# Patient Record
Sex: Female | Born: 1980 | Race: Asian | Hispanic: No | Marital: Married | Smoking: Former smoker
Health system: Southern US, Community
[De-identification: ages and names within clinical notes are randomized; demographics above are authoritative.]

## PROBLEM LIST (undated history)

## (undated) DIAGNOSIS — K219 Gastro-esophageal reflux disease without esophagitis: Secondary | ICD-10-CM

## (undated) DIAGNOSIS — R000 Tachycardia, unspecified: Secondary | ICD-10-CM

---

## 2012-02-05 ENCOUNTER — Ambulatory Visit: Payer: Self-pay | Admitting: Family Medicine

## 2015-10-19 ENCOUNTER — Encounter: Payer: Self-pay | Admitting: Emergency Medicine

## 2015-10-19 DIAGNOSIS — Z87891 Personal history of nicotine dependence: Secondary | ICD-10-CM | POA: Insufficient documentation

## 2015-10-19 DIAGNOSIS — R509 Fever, unspecified: Secondary | ICD-10-CM | POA: Diagnosis present

## 2015-10-19 DIAGNOSIS — J069 Acute upper respiratory infection, unspecified: Secondary | ICD-10-CM | POA: Insufficient documentation

## 2015-10-19 LAB — RAPID INFLUENZA A&B ANTIGENS (ARMC ONLY)
INFLUENZA A (ARMC): NEGATIVE
INFLUENZA B (ARMC): NEGATIVE

## 2015-10-19 LAB — POCT RAPID STREP A: STREPTOCOCCUS, GROUP A SCREEN (DIRECT): NEGATIVE

## 2015-10-19 NOTE — ED Notes (Signed)
Pt presents to ED with fever sore throat congestion and headache since Saturday. Pt states she has been taking tylenol at home with very little relief. Pt currently has no increased work of breathing or acute distress noted at this time. Wants to know if she has strep throat. Last time medicated for fever was "just before coming" to ED.

## 2015-10-20 ENCOUNTER — Emergency Department
Admission: EM | Admit: 2015-10-20 | Discharge: 2015-10-20 | Disposition: A | Discharge status: Home / Self Care | Attending: Emergency Medicine | Admitting: Emergency Medicine

## 2015-10-20 DIAGNOSIS — J069 Acute upper respiratory infection, unspecified: Secondary | ICD-10-CM

## 2015-10-20 HISTORY — DX: Gastro-esophageal reflux disease without esophagitis: K21.9

## 2015-10-20 MED ORDER — AZITHROMYCIN 500 MG PO TABS
500.0000 mg | ORAL_TABLET | Freq: Once | ORAL | Status: AC
  Administered 2015-10-20: 500 mg via ORAL
  Filled 2015-10-20: qty 1

## 2015-10-20 MED ORDER — AZITHROMYCIN 500 MG PO TABS: 500.0000 mg | ORAL_TABLET | Freq: Every day | ORAL | Status: AC

## 2015-10-20 NOTE — ED Notes (Signed)
Pt discharged to home.  Family member driving.  Discharge instructions reviewed.  Verbalized understanding.  No questions or concerns at this time.  Teach back verified.  Pt in NAD.  No items left in ED.   

## 2015-10-20 NOTE — Discharge Instructions (Signed)
Upper Respiratory Infection, Adult Most upper respiratory infections (URIs) are a viral infection of the air passages leading to the lungs. A URI affects the nose, throat, and upper air passages. The most common type of URI is nasopharyngitis and is typically referred to as "the common cold." URIs run their course and usually go away on their own. Most of the time, a URI does not require medical attention, but sometimes a bacterial infection in the upper airways can follow a viral infection. This is called a secondary infection. Sinus and middle ear infections are common types of secondary upper respiratory infections. Bacterial pneumonia can also complicate a URI. A URI can worsen asthma and chronic obstructive pulmonary disease (COPD). Sometimes, these complications can require emergency medical care and may be life threatening.  CAUSES Almost all URIs are caused by viruses. A virus is a type of germ and can spread from one person to another.  RISKS FACTORS You may be at risk for a URI if:   You smoke.   You have chronic heart or lung disease.  You have a weakened defense (immune) system.   You are very young or very old.   You have nasal allergies or asthma.  You work in crowded or poorly ventilated areas.  You work in health care facilities or schools. SIGNS AND SYMPTOMS  Symptoms typically develop 2-3 days after you come in contact with a cold virus. Most viral URIs last 7-10 days. However, viral URIs from the influenza virus (flu virus) can last 14-18 days and are typically more severe. Symptoms may include:   Runny or stuffy (congested) nose.   Sneezing.   Cough.   Sore throat.   Headache.   Fatigue.   Fever.   Loss of appetite.   Pain in your forehead, behind your eyes, and over your cheekbones (sinus pain).  Muscle aches.  DIAGNOSIS  Your health care provider may diagnose a URI by:  Physical exam.  Tests to check that your symptoms are not due to  another condition such as:  Strep throat.  Sinusitis.  Pneumonia.  Asthma. TREATMENT  A URI goes away on its own with time. It cannot be cured with medicines, but medicines may be prescribed or recommended to relieve symptoms. Medicines may help:  Reduce your fever.  Reduce your cough.  Relieve nasal congestion. HOME CARE INSTRUCTIONS   Take medicines only as directed by your health care provider.   Gargle warm saltwater or take cough drops to comfort your throat as directed by your health care provider.  Use a warm mist humidifier or inhale steam from a shower to increase air moisture. This may make it easier to breathe.  Drink enough fluid to keep your urine clear or pale yellow.   Eat soups and other clear broths and maintain good nutrition.   Rest as needed.   Return to work when your temperature has returned to normal or as your health care provider advises. You may need to stay home longer to avoid infecting others. You can also use a face mask and careful hand washing to prevent spread of the virus.  Increase the usage of your inhaler if you have asthma.   Do not use any tobacco products, including cigarettes, chewing tobacco, or electronic cigarettes. If you need help quitting, ask your health care provider. PREVENTION  The best way to protect yourself from getting a cold is to practice good hygiene.   Avoid oral or hand contact with people with cold   symptoms.   Wash your hands often if contact occurs.  There is no clear evidence that vitamin C, vitamin E, echinacea, or exercise reduces the chance of developing a cold. However, it is always recommended to get plenty of rest, exercise, and practice good nutrition.  SEEK MEDICAL CARE IF:   You are getting worse rather than better.   Your symptoms are not controlled by medicine.   You have chills.  You have worsening shortness of breath.  You have Marshal Schrecengost or red mucus.  You have yellow or Seydou Hearns nasal  discharge.  You have pain in your face, especially when you bend forward.  You have a fever.  You have swollen neck glands.  You have pain while swallowing.  You have [place] areas in the back of your throat. SEEK IMMEDIATE MEDICAL CARE IF:   You have severe or persistent:  Headache.  Ear pain.  Sinus pain.  Chest pain.  You have chronic lung disease and any of the following:  Wheezing.  Prolonged cough.  Coughing up blood.  A change in your usual mucus.  You have a stiff neck.  You have changes in your:  Vision.  Hearing.  Thinking.  Mood. MAKE SURE YOU:   Understand these instructions.  Will watch your condition.  Will get help right away if you are not doing well or get worse.   This information is not intended to replace advice given to you by your health care provider. Make sure you discuss any questions you have with your health care provider.   Document Released: 01/03/2001 Document Revised: 11/24/2014 Document Reviewed: 10/15/2013 Elsevier Interactive Patient Education 2016 Elsevier Inc.  

## 2015-10-22 LAB — CULTURE, GROUP A STREP (THRC)

## 2015-10-22 NOTE — ED Provider Notes (Signed)
[facility] Emergency Department Provider Note  ____________________________________________  Time seen: 2:00 AM  I have reviewed the triage vital signs and the nursing notes.   HISTORY  Chief Complaint Fever; Sore Throat; Headache; and Nasal Congestion    HPI Paula Andrade is a 35 y.o. female presents with fever sore throat and congestion accompanied by generalized headache since Saturday. Patient states that she's been taking Tylenol at home with minimal relief. Patient denies any dyspnea no nausea vomiting or diarrhea patient febrile on presentation to the emergency department a temperature 101.5     Past Medical History  Diagnosis Date  . GERD (gastroesophageal reflux disease)     There are no active problems to display for this patient.   Past Surgical History  Procedure Laterality Date  . Cesarean section      Current Outpatient Rx  Name  Route  Sig  Dispense  Refill  . azithromycin (ZITHROMAX) 500 MG tablet   Oral   Take 1 tablet (500 mg total) by mouth daily.   3 tablet   0     Allergies Review of patient's allergies indicates no known allergies.  No family history on file.  Social History Social History  Substance Use Topics  . Smoking status: Former Games developer  . Smokeless tobacco: Never Used  . Alcohol Use: Yes    Review of Systems  Constitutional: Negative for fever. Eyes: Negative for visual changes. ENT: Positive for sore throat. Positive for congestion Cardiovascular: Negative for chest pain. Respiratory: Negative for shortness of breath. Gastrointestinal: Negative for abdominal pain, vomiting and diarrhea. Genitourinary: Negative for dysuria. Musculoskeletal: Negative for back pain. Skin: Negative for rash. Neurological: Negative for headaches, focal weakness or numbness.   10-point ROS otherwise negative.  ____________________________________________   PHYSICAL EXAM:  VITAL SIGNS: ED Triage Vitals   Enc Vitals Group     BP 10/19/15 2226 100/55 mmHg     Pulse Rate 10/19/15 2226 105     Resp 10/19/15 2226 18     Temp 10/19/15 2226 101.5 F (38.6 C)     Temp Source 10/19/15 2226 Oral     SpO2 10/19/15 2226 96 %     Weight 10/19/15 2226 100 lb (45.36 kg)     Height 10/19/15 2226 5' (1.524 m)     Head Cir --      Peak Flow --      Pain Score 10/19/15 2227 7     Pain Loc --      Pain Edu? --      Excl. in GC? --      Constitutional: Alert and oriented. Well appearing and in no distress. Eyes: Conjunctivae are normal. PERRL. Normal extraocular movements. ENT   Head: Normocephalic and atraumatic.   Nose: No congestion/rhinnorhea.   Mouth/Throat: Mucous membranes are moist.Positive for pharyngeal erythema   Neck: No stridor. Hematological/Lymphatic/Immunilogical: No cervical lymphadenopathy. Cardiovascular: Normal rate, regular rhythm. Normal and symmetric distal pulses are present in all extremities. No murmurs, rubs, or gallops. Respiratory: Normal respiratory effort without tachypnea nor retractions. Breath sounds are clear and equal bilaterally. No wheezes/rales/rhonchi. Gastrointestinal: Soft and nontender. No distention. There is no CVA tenderness. Genitourinary: deferred Musculoskeletal: Nontender with normal range of motion in all extremities. No joint effusions.  No lower extremity tenderness nor edema. Neurologic:  Normal speech and language. No gross focal neurologic deficits are appreciated. Speech is normal.  Skin:  Skin is warm, dry and intact. No rash noted. Psychiatric: Mood and affect are normal.  Speech and behavior are normal. Patient exhibits appropriate insight and judgment.  ____________________________________________    LABS (pertinent positives/negatives)  Labs Reviewed  RAPID INFLUENZA A&B ANTIGENS ([facility] ONLY)  CULTURE, GROUP A STREP [facility])  POCT RAPID STREP A      INITIAL IMPRESSION / ASSESSMENT AND PLAN / ED COURSE  Pertinent  labs & imaging results that were available during my care of the patient were reviewed by me and considered in my medical decision making (see chart for details).    ____________________________________________   FINAL CLINICAL IMPRESSION(S) / ED DIAGNOSES  Final diagnoses:  URI (upper respiratory infection)      Darci Currentandolph N Sharron Simpson, MD 10/22/15 [phone]

## 2016-10-18 ENCOUNTER — Emergency Department

## 2016-10-18 ENCOUNTER — Emergency Department
Admission: EM | Admit: 2016-10-18 | Discharge: 2016-10-18 | Disposition: A | Discharge status: Home / Self Care | Attending: Emergency Medicine | Admitting: Emergency Medicine

## 2016-10-18 ENCOUNTER — Encounter: Payer: Self-pay | Admitting: Emergency Medicine

## 2016-10-18 DIAGNOSIS — R000 Tachycardia, unspecified: Secondary | ICD-10-CM | POA: Diagnosis present

## 2016-10-18 DIAGNOSIS — R7989 Other specified abnormal findings of blood chemistry: Secondary | ICD-10-CM | POA: Diagnosis not present

## 2016-10-18 DIAGNOSIS — R778 Other specified abnormalities of plasma proteins: Secondary | ICD-10-CM

## 2016-10-18 DIAGNOSIS — I471 Supraventricular tachycardia: Secondary | ICD-10-CM

## 2016-10-18 DIAGNOSIS — Z87891 Personal history of nicotine dependence: Secondary | ICD-10-CM | POA: Insufficient documentation

## 2016-10-18 HISTORY — DX: Tachycardia, unspecified: R00.0

## 2016-10-18 LAB — BASIC METABOLIC PANEL
Anion gap: 8 (ref 5–15)
BUN: 12 mg/dL (ref 6–20)
CALCIUM: 8.9 mg/dL (ref 8.9–10.3)
CO2: 23 mmol/L (ref 22–32)
CREATININE: 0.68 mg/dL (ref 0.44–1.00)
Chloride: 107 mmol/L (ref 101–111)
GFR calc non Af Amer: >60 mL/min (ref >60)
Glucose, Bld: 116 mg/dL — ABNORMAL HIGH (ref 65–99)
Potassium: 3.5 mmol/L (ref 3.5–5.1)
SODIUM: 138 mmol/L (ref 135–145)

## 2016-10-18 LAB — CBC
HCT: 39.8 % (ref 35.0–47.0)
Hemoglobin: 13.6 g/dL (ref 12.0–16.0)
MCH: 31.6 pg (ref 26.0–34.0)
MCHC: 34.3 g/dL (ref 32.0–36.0)
MCV: 92.1 fL (ref 80.0–100.0)
PLATELETS: 212 10*3/uL (ref 150–440)
RBC: 4.32 MIL/uL (ref 3.80–5.20)
RDW: 13.1 % (ref 11.5–14.5)
WBC: 7.7 10*3/uL (ref 3.6–11.0)

## 2016-10-18 LAB — TROPONIN I: TROPONIN I: 0.63 ng/mL — AB (ref <0.03)

## 2016-10-18 LAB — MAGNESIUM: Magnesium: 1.9 mg/dL (ref 1.7–2.4)

## 2016-10-18 MED ORDER — ADENOSINE 6 MG/2ML IV SOLN
6.0000 mg | Freq: Once | INTRAVENOUS | Status: AC
  Administered 2016-10-18: 6 mg via INTRAVENOUS

## 2016-10-18 MED ORDER — METOPROLOL TARTRATE 25 MG PO TABS: ORAL_TABLET | ORAL | 1 refills | Status: AC

## 2016-10-18 MED ORDER — ADENOSINE 12 MG/4ML IV SOLN
INTRAVENOUS | Status: AC
  Filled 2016-10-18: qty 4

## 2016-10-18 MED ORDER — ADENOSINE 6 MG/2ML IV SOLN
INTRAVENOUS | Status: AC
  Administered 2016-10-18: 6 mg via INTRAVENOUS
  Filled 2016-10-18: qty 2

## 2016-10-18 NOTE — ED Notes (Signed)
Pt connected to monitor, zoll and EKG machine. Pt has NS bolus attached to IV. Pt has failed vagal maneuvers. MD at bedside. Ambu bag in place and suction ready if needed.

## 2016-10-18 NOTE — Discharge Instructions (Signed)
As we discussed, your signs and symptoms strongly suggest a condition called paroxysmal supraventricular tachycardia (PSVT).  This is a generally benign condition, but we recommend you follow up with the listed cardiologist for further evaluation.  Please read through the included information and try some of the techniques we discussed the next time you have the symptoms. ° °Return to the Emergency Department if you develop new or worsening symptoms that concern you. °

## 2016-10-18 NOTE — ED Triage Notes (Addendum)
Pt to ED via POV with c/o palpitations since this am. PT states this happened last year and she was not seen. Pt HR currently 160s. Pt c/o CP

## 2016-10-18 NOTE — ED Provider Notes (Signed)
[facility] Emergency Department Provider Note  ____________________________________________   First MD Initiated Contact with Patient 10/18/16 1637     (approximate)  I have reviewed the triage vital signs and the nursing notes.   HISTORY  Chief Complaint Tachycardia    HPI Paula Andrade is a 36 y.o. female who is generally healthy with no chronic medical issues and presents for evaluation of heart palpitations and chest tightness for about 8 hours.  She reports that this kind of sensation has happened to her in the past multiple times but it typically lasts for at most a few hours.  because it has been going on all day today she felt she should be evaluated.  Exertion makes her feel worse and rest does not help it feel better.she denies any recent illnesses, nausea/vomiting, abdominal pain.  She feels that it is a bit difficult to catch her breath.  The palpitations are moderate, the chest tightness and shortness of breath are mild.  The onset of the palpitations was acute while she was resting this morning.   Past Medical History:  Diagnosis Date  . GERD (gastroesophageal reflux disease)   . Tachycardia     There are no active problems to display for this patient.   Past Surgical History:  Procedure Laterality Date  . CESAREAN SECTION      Prior to Admission medications   Medication Sig Start Date End Date Taking? Authorizing Provider  TRINESSA LO 0.18/0.215/0.25 MG-25 MCG tab Take 1 tablet by mouth daily. 09/08/16  Yes Historical Provider, MD  metoprolol tartrate (LOPRESSOR) 25 MG tablet Take 1/2 tablet (12.5 mg) PO as needed every 4 hours if you develop palpitations/SVT 10/18/16 10/19/16  Loleta Rose, MD    Allergies Patient has no known allergies.  No family history on file.  Social History Social History  Substance Use Topics  . Smoking status: Former Games developer  . Smokeless tobacco: Never Used  . Alcohol use Yes    Review of  Systems Constitutional: No fever/chills Eyes: No visual changes. ENT: No sore throat. Cardiovascular: Palpitations and chest tightness. Respiratory: Mild SOB associated with the palpitations Gastrointestinal: No abdominal pain.  No nausea, no vomiting.  No diarrhea.  No constipation. Genitourinary: Negative for dysuria. Musculoskeletal: Negative for back pain. Skin: Negative for rash. Neurological: Negative for headaches, focal weakness or numbness.  10-point ROS otherwise negative.  ____________________________________________   PHYSICAL EXAM:  VITAL SIGNS: ED Triage Vitals [10/18/16 1625]  Enc Vitals Group     BP 102/76     Pulse Rate (!) 168     Resp 18     Temp 98.2 F (36.8 C)     Temp Source Oral     SpO2 98 %     Weight      Height      Head Circumference      Peak Flow      Pain Score      Pain Loc      Pain Edu?      Excl. in GC?     Constitutional: Alert and oriented. Well appearing and in no acute distress. Eyes: Conjunctivae are normal. PERRL. EOMI. Head: Atraumatic. Nose: No congestion/rhinnorhea. Mouth/Throat: Mucous membranes are moist. Neck: No stridor.  No meningeal signs.   Cardiovascular: rapid tachycardia, regular rhythm. Good peripheral circulation. Grossly normal heart sounds. Respiratory: Normal respiratory effort.  No retractions. Lungs CTAB. Gastrointestinal: Soft and nontender. No distention.  Musculoskeletal: No lower extremity tenderness nor edema. No gross  deformities of extremities. Neurologic:  Normal speech and language. No gross focal neurologic deficits are appreciated.  Skin:  Skin is warm, dry and intact. No rash noted. Psychiatric: Mood and affect are normal. Speech and behavior are normal.  ____________________________________________   LABS (all labs ordered are listed, but only abnormal results are displayed)  Labs Reviewed  BASIC METABOLIC PANEL - Abnormal; Notable for the following:       Result Value   Glucose,  Bld 116 (*)    All other components within normal limits  TROPONIN I - Abnormal; Notable for the following:    Troponin I 0.63 (*)    All other components within normal limits  CBC  MAGNESIUM   ____________________________________________  EKG  ED ECG REPORT #1 I, Wanona Stare, the attending physician, personally viewed and interpreted this ECG.  Date: 10/18/2016 EKG Time: 16:28 Rate: 162 Rhythm: SVT QRS Axis: normal Intervals: no P waves due to SVT ST/T Wave abnormalities: essentially globally inverted T waves. Conduction Disturbances: SVT Narrative Interpretation: Non-specific ST segment / T-wave changes, but no evidence of acute ischemia.   ED ECG REPORT #2 (post-adenosine) I, Dionicia Cerritos, the attending physician, personally viewed and interpreted this ECG.  Date: 10/18/2016 EKG Time: 16:57 Rate: 85 Rhythm: normal sinus rhythm QRS Axis: normal Intervals: normal ST/T Wave abnormalities: normal Conduction Disturbances: none Narrative Interpretation: unremarkable  ____________________________________________  RADIOLOGY   No results found.  ____________________________________________   PROCEDURES  Critical Care performed: Yes, see critical care procedure note(s)   Procedure(s) performed:   .Critical Care Performed by: Loleta Rose Authorized by: Loleta Rose   Critical care provider statement:    Critical care time (minutes):  30   Critical care time was exclusive of:  Separately billable procedures and treating other patients   Critical care was necessary to treat or prevent imminent or life-threatening deterioration of the following conditions:  Circulatory failure   Critical care was time spent personally by me on the following activities:  Development of treatment plan with patient or surrogate, discussions with consultants, evaluation of patient's response to treatment, examination of patient, obtaining history from patient or surrogate,  ordering and performing treatments and interventions, ordering and review of laboratory studies, ordering and review of radiographic studies, pulse oximetry, re-evaluation of patient's condition and review of old charts      ____________________________________________   INITIAL IMPRESSION / ASSESSMENT AND PLAN / ED COURSE  Pertinent labs & imaging results that were available during my care of the patient were reviewed by me and considered in my medical decision making (see chart for details).  the patient is in SVT with a normal blood pressure.  I tried multiple different vagal maneuvers and carotid massage and she did not have any improvement of her heart rate of about 160.  we are hooking her up to a Zoll and will proceed with chemical cardioversion after IV discussed the risks and benefits of adenosine and obtained verbal consent.   Clinical Course as of Oct 19 1931  Wed Oct 18, 2016  1700 The patient was cardioverted using adenosine from SVT to normal sinus rhythm with a heart rate of about 85.  The patient feels asymptomatic at this time.  I will check electrolytes and will not check a troponin given that it is likely to be unhelpful and misleading.  Allowing fluids to run.  [CF]  1703 Troponin was already ordered and is in process.  Will not cancel but will accept that there may be a  degree of demand ischemia.  [CF]  [id number] Spoke by phone with Dr[facility] Duke Salviaandolph [facility].  She agreed that repeat troponin is not necessary, suggested metoprolol tartrate 12.5 mg PO PRN for palpitations, and close outpatient follow up.  Will continue to monitor the patient, but anticipate discharge soon.  [CF]    Clinical Course User Index [CF] Loleta Roseory Orabelle Rylee, MD    ____________________________________________  FINAL CLINICAL IMPRESSION(S) / ED DIAGNOSES  Final diagnoses:  SVT (supraventricular tachycardia) (HCC)  Elevated troponin I level     MEDICATIONS GIVEN DURING THIS  VISIT:  Medications  adenosine (ADENOCARD) 12 MG/4ML injection (not administered)  adenosine (ADENOCARD) 6 MG/2ML injection 6 mg (6 mg Intravenous Given 10/18/16 1657)     NEW OUTPATIENT MEDICATIONS STARTED DURING THIS VISIT:  New Prescriptions   METOPROLOL TARTRATE (LOPRESSOR) 25 MG TABLET    Take 1/2 tablet (12.5 mg) PO as needed every 4 hours if you develop palpitations/SVT    Modified Medications   No medications on file    Discontinued Medications   No medications on file     Note:  This document was prepared using Dragon voice recognition software and may include unintentional dictation errors.    Loleta Roseory Kyrielle Urbanski, MD 10/18/16 [phone]

## 2016-10-18 NOTE — ED Notes (Signed)
Pt remained alert and oriented. Pt in NAD. BP stable and respiratory rate is WNL.

## 2016-10-19 ENCOUNTER — Telehealth: Payer: Self-pay

## 2016-10-19 NOTE — Telephone Encounter (Signed)
Lmov for patient to call back to make an follow up appointment  She was seen in ED on 10/18/16 for Tachycardia  Will try again later time.

## 2016-11-07 ENCOUNTER — Encounter: Payer: Self-pay | Admitting: Cardiology

## 2016-11-07 ENCOUNTER — Ambulatory Visit (INDEPENDENT_AMBULATORY_CARE_PROVIDER_SITE_OTHER): Admitting: Cardiology

## 2016-11-07 VITALS — BP 98/64 | HR 59 | Ht 60.0 in | Wt 118.0 lb

## 2016-11-07 DIAGNOSIS — I471 Supraventricular tachycardia: Secondary | ICD-10-CM | POA: Diagnosis not present

## 2016-11-07 NOTE — Patient Instructions (Addendum)
Testing/Procedures: Your physician has requested that you have an echocardiogram. Echocardiography is a painless test that uses sound waves to create images of your heart. It provides your doctor with information about the size and shape of your heart and how well your heart's chambers and valves are working. This procedure takes approximately one hour. There are no restrictions for this procedure.    Follow-Up: You have been referred to Dr. Graciela Husbands for fast heart rates.   Your physician recommends that you schedule a follow-up appointment as needed.   It was a pleasure seeing you today here in the office. Please do not hesitate to give [place] a call back if you have any further questions. [phone]  Martin Cellar RN, BSN    Echocardiogram An echocardiogram, or echocardiography, uses sound waves (ultrasound) to produce an image of your heart. The echocardiogram is simple, painless, obtained within a short period of time, and offers valuable information to your health care provider. The images from an echocardiogram can provide information such as:  Evidence of coronary artery disease (CAD).  Heart size.  Heart muscle function.  Heart valve function.  Aneurysm detection.  Evidence of a past heart attack.  Fluid buildup around the heart.  Heart muscle thickening.  Assess heart valve function. Tell a health care provider about:  Any allergies you have.  All medicines you are taking, including vitamins, herbs, eye drops, creams, and over-the-counter medicines.  Any problems you or family members have had with anesthetic medicines.  Any blood disorders you have.  Any surgeries you have had.  Any medical conditions you have.  Whether you are pregnant or may be pregnant. What happens before the procedure? No special preparation is needed. Eat and drink normally. What happens during the procedure?  In order to produce an image of your heart, gel will be applied to your chest  and a wand-like tool (transducer) will be moved over your chest. The gel will help transmit the sound waves from the transducer. The sound waves will harmlessly bounce off your heart to allow the heart images to be captured in real-time motion. These images will then be recorded.  You may need an IV to receive a medicine that improves the quality of the pictures. What happens after the procedure? You may return to your normal schedule including diet, activities, and medicines, unless your health care provider tells you otherwise. This information is not intended to replace advice given to you by your health care provider. Make sure you discuss any questions you have with your health care provider. Document Released: 07/07/2000 Document Revised: 02/26/2016 Document Reviewed: 03/17/2013 Elsevier Interactive Patient Education  2017 [employer].

## 2016-11-07 NOTE — Progress Notes (Signed)
Cardiology Office Note   Date:  11/07/2016   ID:  Paula Andrade, DOB Oct 01, 1980, MRN [id number]  Referring Doctor:  Pcp Not In System   Cardiologist:   Almond Lint, MD   Reason for consultation:  Chief Complaint  Patient presents with  . other    Tachycardia c/o heart palpitations . Meds reviewed verbally with pt.      History of Present Illness: Paula Andrade is a 36 y.o. female who is being seen today for the evaluation of SVT   Patient presented to the ER 10/10/2016 for palpitations. She has history of palpitations for many years now. That episode that morning was more intense and lasted several hours at least 8 hours. No chest pain. Just felt her heart beating really fast. In the ER, she had an EKG there was personally reviewed by me today and it showed narrow complex tachycardia at a ventricular rate of 162 bpm, likely short RP tachycardia/SVT. She was given adenosine and probably converted to sinus rhythm. She was prescribed Lopressor when necessary. She has not needed to take this.  Patient recalls having episodes of rapid heart rate for 10+ years. There've randomly occurring, usually lasts an hour or less. He spontaneously resolve. No associated chest pain or shortness of breath. She can recall at least 2 episodes where she felt her heart beating really fast and may have passed out for a few seconds or so. She has not really seen a cardiologist in the past. She wonders whether this had something to do with her being not well hydrated.  No shortness of breath with exertion. No PND, no orthopnea, no edema.   ROS:  Please see the history of present illness. Aside from mentioned under HPI, all other systems are reviewed and negative.     Past Medical History:  Diagnosis Date  . GERD (gastroesophageal reflux disease)   . Tachycardia     Past Surgical History:  Procedure Laterality Date  . CESAREAN SECTION       reports that she has quit smoking. She quit after  12.00 years of use. She has never used smokeless tobacco. She reports that she drinks alcohol. She reports that she does not use drugs.   family history is not on file. She was adopted.   Outpatient Medications Prior to Visit  Medication Sig Dispense Refill  . metoprolol tartrate (LOPRESSOR) 25 MG tablet Take 1/2 tablet (12.5 mg) PO as needed every 4 hours if you develop palpitations/SVT 30 tablet 1  . TRINESSA LO 0.18/0.215/0.25 MG-25 MCG tab Take 1 tablet by mouth daily.     No facility-administered medications prior to visit.      Allergies: Patient has no known allergies.    PHYSICAL EXAM: VS:  BP 98/64 (BP Location: Right Arm, Patient Position: Sitting, Cuff Size: Normal)   Pulse (!) 59   Ht 5' (1.524 m)   Wt 118 lb (53.5 kg)   LMP 10/09/2016   BMI 23.05 kg/m  , Body mass index is 23.05 kg/m. Wt Readings from Last 3 Encounters:  11/07/16 118 lb (53.5 kg)  10/19/15 100 lb (45.4 kg)    GENERAL:  well developed, well nourished, not in acute distress HEENT: normocephalic, pink conjunctivae, anicteric sclerae, no xanthelasma, normal dentition, oropharynx clear NECK:  no neck vein engorgement, JVP normal, no hepatojugular reflux, carotid upstroke brisk and symmetric, no bruit, no thyromegaly, no lymphadenopathy LUNGS:  good respiratory effort, clear to auscultation bilaterally CV:  PMI not displaced, no thrills,  no lifts, S1 and S2 within normal limits, no palpable S3 or S4, no murmurs, no rubs, no gallops ABD:  Soft, nontender, nondistended, normoactive bowel sounds, no abdominal aortic bruit, no hepatomegaly, no splenomegaly MS: nontender back, no kyphosis, no scoliosis, no joint deformities EXT:  2+ DP/PT pulses, no edema, no varicosities, no cyanosis, no clubbing SKIN: warm, nondiaphoretic, normal turgor, no ulcers NEUROPSYCH: alert, oriented to person, place, and time, sensory/motor grossly intact, normal mood, appropriate affect  Recent Labs: 10/18/2016: BUN 12;  Creatinine, Ser 0.68; Hemoglobin 13.6; Magnesium 1.9; Platelets 212; Potassium 3.5; Sodium 138   Lipid Panel No results found for: CHOL, TRIG, HDL, CHOLHDL, VLDL, LDLCALC, LDLDIRECT   Other studies Reviewed:  EKG:  The ekg from 11/07/2016 was personally reviewed by me and it revealed sinus rhythm 59 BPM  Additional studies/ records that were reviewed personally reviewed by me today include: None available   ASSESSMENT AND PLAN: SVT/short RP tachycardia EKG from 10/18/2016 was personally reviewed. We had a lengthy and detailed discussion regarding the pathophysiology, definitive treatment of ablation, risks and benefits of treatment. Recommend echocardiogram. Recommend referral to EP for possible ablation.    Current medicines are reviewed at length with the patient today.  The patient does not have concerns regarding medicines.  Labs/ tests ordered today include:  Orders Placed This Encounter  Procedures  . Ambulatory referral to Cardiac Electrophysiology  . EKG 12-Lead  . ECHOCARDIOGRAM COMPLETE    I had a lengthy and detailed discussion with the patient regarding diagnoses, prognosis, diagnostic options, treatment options , and side effects of medications.    I spent at least 60 minutes with the patient today and more than 50% of the time was spent counseling the patient and coordinating care.    Disposition:   FU with Cardiology after tests/EP prn  Signed, Almond Lint, MD  11/07/2016 5:33 PM    [facility]  This note was generated in part with voice recognition software and I apologize for any typographical errors that were not detected and corrected.

## 2016-12-12 ENCOUNTER — Other Ambulatory Visit: Payer: Self-pay

## 2016-12-12 ENCOUNTER — Ambulatory Visit (INDEPENDENT_AMBULATORY_CARE_PROVIDER_SITE_OTHER)

## 2016-12-12 DIAGNOSIS — I471 Supraventricular tachycardia: Secondary | ICD-10-CM

## 2016-12-28 ENCOUNTER — Ambulatory Visit: Admitting: Internal Medicine

## 2016-12-28 ENCOUNTER — Ambulatory Visit (INDEPENDENT_AMBULATORY_CARE_PROVIDER_SITE_OTHER): Admitting: Internal Medicine

## 2016-12-28 ENCOUNTER — Encounter: Payer: Self-pay | Admitting: Internal Medicine

## 2016-12-28 VITALS — BP 90/60 | HR 59 | Ht 60.0 in | Wt 115.5 lb

## 2016-12-28 DIAGNOSIS — I471 Supraventricular tachycardia: Secondary | ICD-10-CM

## 2016-12-28 NOTE — Progress Notes (Signed)
ELECTROPHYSIOLOGY CONSULT NOTE  Patient ID: Paula Andrade, MRN: [id number], DOB/AGE: [birth year] 36 y.o. Admit date: (Not on file) Date of Consult: 12/28/2016  Primary Physician: System, Pcp Not In Primary Cardiologist: AI/CE   Paula Andrade is being seen today for the evaluation of SVT at the request of Dr Alvino Chapel  HPI Paula Andrade is a 36 y.o. female   has a 10 year  history of abrupt onset-offset palpitations.  They occur 2  times per year.  They are associated with Yes sob, Yes cp, YesLH,  Yespresyncope or  Yessyncope.  They are frog Negative  and diuretic Negative. They are  aggravated by caffeine, exertion, bending over.  Episodes typically last minutes--hours. They are more problematic of late although not so much more frequent in terms of longer episodes. She does think she has been having more shorter episodes over the last few months.  Out of the context of these episodes she has no cardiovascular limitations. Specifically no chest pain shortness of breath edema nocturnal dyspnea or orthopnea. She is adopted and has no family history  ECG are reviewed below  Echo 5/18 normal LV function; normal valve structure    Past Medical History:  Diagnosis Date  . GERD (gastroesophageal reflux disease)   . Tachycardia       Surgical History:  Past Surgical History:  Procedure Laterality Date  . CESAREAN SECTION       Home Meds: Prior to Admission medications   Medication Sig Start Date End Date Taking? Authorizing Provider  metoprolol tartrate (LOPRESSOR) 25 MG tablet Take 1/2 tablet (12.5 mg) PO as needed every 4 hours if you develop palpitations/SVT 10/18/16 11/07/16  Loleta Rose, MD  Multiple Vitamin (MULTIVITAMIN) capsule Take 1 capsule by mouth daily.    [provider]  TRINESSA LO 0.18/0.215/0.25 MG-25 MCG tab Take 1 tablet by mouth daily. 09/08/16   [provider]    Allergies: No Known Allergies  Social History   Social History  .  Marital status: Married    Spouse name: N/A  . Number of children: N/A  . Years of education: N/A   Occupational History  . Not on file.   Social History Main Topics  . Smoking status: Former Smoker    Years: 12.00  . Smokeless tobacco: Never Used  . Alcohol use Yes     Comment: occassional  . Drug use: No  . Sexual activity: Not on file   Other Topics Concern  . Not on file   Social History Narrative  . No narrative on file     Family History  Problem Relation Age of Onset  . Adopted: Yes     ROS:  Please see the history of present illness.     All other systems reviewed and negative.    Physical Exam:  Blood pressure 90/60, pulse (!) 59, height 5' (1.524 m), weight 115 lb 8 oz (52.4 kg). General: Well developed, well nourished female in no acute distress. Head: Normocephalic, atraumatic, sclera non-icteric, no xanthomas, nares are without discharge. EENT: normal  Lymph Nodes:  none Neck: Negative for carotid bruits. JVD not elevated. Back:without scoliosis kyphosis Lungs: Clear bilaterally to auscultation without wheezes, rales, or rhonchi. Breathing is unlabored. Heart: RRR with S1 S2. No   murmur . No rubs, or gallops appreciated. Abdomen: Soft, non-tender, non-distended with normoactive bowel sounds. No hepatomegaly. No rebound/guarding. No obvious abdominal masses. Msk:  Strength and tone appear normal for age. Extremities: No clubbing or  cyanosis. No  edema.  Distal pedal pulses are 2+ and equal bilaterally. Skin: Warm and Dry Neuro: Alert and oriented X 3. CN III-XII intact Grossly normal sensory and motor function . Psych:  Responds to questions appropriately with a normal affect.      Labs: Cardiac Enzymes No results for input(s): CKTOTAL, CKMB, TROPONINI in the last 72 hours. CBC Lab Results  Component Value Date   WBC 7.7 10/18/2016   HGB 13.6 10/18/2016   HCT 39.8 10/18/2016   MCV 92.1 10/18/2016   PLT 212 10/18/2016   PROTIME: No results  for input(s): LABPROT, INR in the last 72 hours. Chemistry No results for input(s): NA, K, CL, CO2, BUN, CREATININE, CALCIUM, PROT, BILITOT, ALKPHOS, ALT, AST, GLUCOSE in the last 168 hours.  Invalid input(s): LABALBU Lipids No results found for: CHOL, HDL, LDLCALC, TRIG BNP No results found for: PROBNP Thyroid Function Tests: No results for input(s): TSH, T4TOTAL, T3FREE, THYROIDAB in the last 72 hours.  Invalid input(s): FREET3 Miscellaneous No results found for: DDIMER  Radiology/Studies:  No results found.  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 It is associated however with quite significant symptoms including syncope and presyncope although she says she's had warning for the former.  We have discussed treatment options. Daily therapy is not really feasible given her low blood pressure nor is reasonable given the frequency. She has been given when necessary metoprolol which is okay but are not particularly excited about a given her presyncope. We have reviewed carotid massage and Valsalva as maneuvers to potentially terminate her tachycardia. She is advised of her symptoms persist for more than 4 hours should probably go to the emergency room.  We have further reviewed the role of catheter ablation including risks and benefits. At this juncture she would like to not pursue that. She will think about it. Will see her again in 6 months.      Sherryl MangesSteven Jannae Fagerstrom

## 2016-12-28 NOTE — Patient Instructions (Signed)

## 2017-09-05 ENCOUNTER — Other Ambulatory Visit: Payer: Self-pay | Admitting: Family Medicine

## 2017-09-05 ENCOUNTER — Ambulatory Visit
Admission: RE | Admit: 2017-09-05 | Discharge: 2017-09-05 | Disposition: A | Discharge status: Home / Self Care | Source: Ambulatory Visit | Attending: Family Medicine | Admitting: Family Medicine

## 2017-09-05 DIAGNOSIS — M545 Low back pain: Secondary | ICD-10-CM

## 2018-07-08 ENCOUNTER — Encounter: Payer: Self-pay | Admitting: Emergency Medicine

## 2018-07-08 ENCOUNTER — Emergency Department
Admission: EM | Admit: 2018-07-08 | Discharge: 2018-07-08 | Disposition: A | Discharge status: Home / Self Care | Attending: Emergency Medicine | Admitting: Emergency Medicine

## 2018-07-08 ENCOUNTER — Other Ambulatory Visit: Payer: Self-pay

## 2018-07-08 DIAGNOSIS — Z79899 Other long term (current) drug therapy: Secondary | ICD-10-CM | POA: Insufficient documentation

## 2018-07-08 DIAGNOSIS — Z87891 Personal history of nicotine dependence: Secondary | ICD-10-CM | POA: Insufficient documentation

## 2018-07-08 DIAGNOSIS — L509 Urticaria, unspecified: Secondary | ICD-10-CM

## 2018-07-08 DIAGNOSIS — R21 Rash and other nonspecific skin eruption: Secondary | ICD-10-CM | POA: Diagnosis present

## 2018-07-08 MED ORDER — PREDNISONE 10 MG PO TABS: 10.0000 mg | ORAL_TABLET | Freq: Every day | ORAL | 0 refills | Status: DC

## 2018-07-08 MED ORDER — DEXAMETHASONE SODIUM PHOSPHATE 10 MG/ML IJ SOLN
10.0000 mg | Freq: Once | INTRAMUSCULAR | Status: AC
  Administered 2018-07-08: 10 mg via INTRAMUSCULAR
  Filled 2018-07-08: qty 1

## 2018-07-08 NOTE — ED Triage Notes (Signed)
Hives x 2 days, no resp distress.

## 2018-07-08 NOTE — ED Notes (Addendum)
Patient reports itching to body, arms, and face that began on Saturday but has gotten worse.  Patient has hives to left side of her neck and arms.  Patient states she has taken benadryl.   Patient states she was shopping when itching began.

## 2018-07-08 NOTE — ED Provider Notes (Signed)
[facility] EMERGENCY DEPARTMENT Provider Note   CSN: [id number] Arrival date & time: 07/08/18  1609     History   Chief Complaint Chief Complaint  Patient presents with  . Urticaria    HPI Paula Andrade is a 37 y.o. female presents emergency department evaluation urticarial rash.  Rash been present for 3 days.  Rash comes at nighttime and goes away during the day.  Patient denies any new foods, soaps, detergents or medications.  She states today when the rash came on she was at a clothing department but denies trying on any clothing.  She describes a pruritic rash along the neck, arms abdomen and legs.  She has had some Benadryl with some relief.  She denies any fevers, chest pain, shortness of breath, history of anaphylaxis.  HPI  Past Medical History:  Diagnosis Date  . GERD (gastroesophageal reflux disease)   . Tachycardia     There are no active problems to display for this patient.   Past Surgical History:  Procedure Laterality Date  . CESAREAN SECTION       OB History   No obstetric history on file.      Home Medications    Prior to Admission medications   Medication Sig Start Date End Date Taking? Authorizing Provider  cetirizine (ZYRTEC) 10 MG chewable tablet Chew 10 mg by mouth as needed for allergies.    [provider]  metoprolol tartrate (LOPRESSOR) 25 MG tablet Take 1/2 tablet (12.5 mg) PO as needed every 4 hours if you develop palpitations/SVT Patient not taking: Reported on 12/28/2016 10/18/16 11/07/16  Loleta Rose, MD  predniSONE (DELTASONE) 10 MG tablet Take 1 tablet (10 mg total) by mouth daily. 6,5,4,3,2,1 six day taper 07/08/18   Evon Slack, PA-C  TRINESSA LO 0.18/0.215/0.25 MG-25 MCG tab Take 1 tablet by mouth daily. 09/08/16   [provider]    Family History Family History  Adopted: Yes    Social History Social History   Tobacco Use  . Smoking status: Former Smoker    Years: 12.00  .  Smokeless tobacco: Never Used  Substance Use Topics  . Alcohol use: Yes    Comment: occassional  . Drug use: No     Allergies   Patient has no known allergies.   Review of Systems Review of Systems  Constitutional: Negative for fever.  Respiratory: Negative for shortness of breath.   Cardiovascular: Negative for chest pain.  Gastrointestinal: Negative for abdominal pain.  Genitourinary: Negative for difficulty urinating, dysuria and urgency.  Musculoskeletal: Negative for back pain and myalgias.  Skin: Positive for rash.  Neurological: Negative for dizziness and headaches.     Physical Exam Updated Vital Signs BP 108/66   Pulse 83   Temp 98.9 F (37.2 C) (Oral)   Resp 18   Ht 5' (1.524 m)   Wt 49.9 kg   LMP 06/22/2018   SpO2 100%   BMI 21.48 kg/m   Physical Exam Constitutional:      Appearance: She is well-developed.  HENT:     Head: Normocephalic and atraumatic.     Comments: No facial swelling or angioedema Eyes:     Conjunctiva/sclera: Conjunctivae normal.  Neck:     Musculoskeletal: Normal range of motion.  Cardiovascular:     Rate and Rhythm: Normal rate.  Pulmonary:     Effort: Pulmonary effort is normal. No respiratory distress.     Breath sounds: No wheezing or rales.  Musculoskeletal: Normal range  of motion.  Skin:    General: Skin is warm.     Findings: Rash present.     Comments: Urticarial type rash throughout the arms, neck abdomen and upper thighs.  No facial rashes or angioedema.  Urticarial type rash appears as a raised erythematous lesion that is 2 to 3 cm long 1 to 2 cm wide, slightly raised with no vesicles noted.  Rash is dry.  Neurological:     Mental Status: She is alert and oriented to person, place, and time.  Psychiatric:        Behavior: Behavior normal.        Thought Content: Thought content normal.      ED Treatments / Results  Labs (all labs ordered are listed, but only abnormal results are displayed) Labs Reviewed -  No data to display  EKG None  Radiology No results found.  Procedures Procedures (including critical care time)  Medications Ordered in ED Medications  dexamethasone (DECADRON) injection 10 mg (10 mg Intramuscular Given 07/08/18 1742)     Initial Impression / Assessment and Plan / ED Course  I have reviewed the triage vital signs and the nursing notes.  Pertinent labs & imaging results that were available during my care of the patient were reviewed by me and considered in my medical decision making (see chart for details).     37 year old female with urticarial type rash.  She is started on dexamethasone IM and given a 6-day steroid taper.  She will continue with Benadryl.  She understands signs symptoms return to ED for.  No signs of angioedema, lungs are clear and vital signs are stable at discharge.  No signs of anaphylaxis. Final Clinical Impressions(s) / ED Diagnoses   Final diagnoses:  Urticaria    ED Discharge Orders         Ordered    predniSONE (DELTASONE) 10 MG tablet  Daily     07/08/18 1750           Ronnette JuniperGaines, Thomas C, PA-C 07/08/18 1752    Jeanmarie PlantMcShane, James A, MD 07/08/18 2230

## 2018-07-08 NOTE — Discharge Instructions (Signed)
Please continue with Benadryl as needed.  Take prednisone as prescribed.  If any worsening rash shortness of breath difficulty breathing or swallowing return to the ER immediately.  Follow-up PCP if no improvement 1 week.

## 2018-12-16 IMAGING — CR DG SI JOINTS 3+V
1 series · 3 of 3 positions shown · non-contrast
Comparison: None.

CLINICAL DATA: Pain across the lower back and hips x5 months.
Sciatica.

EXAM:
BILATERAL SACROILIAC JOINTS - 3+ VIEW

[Series 1: dg si joints · 0.14mm/px · 3 of 3 slices shown]
[im 1/3]
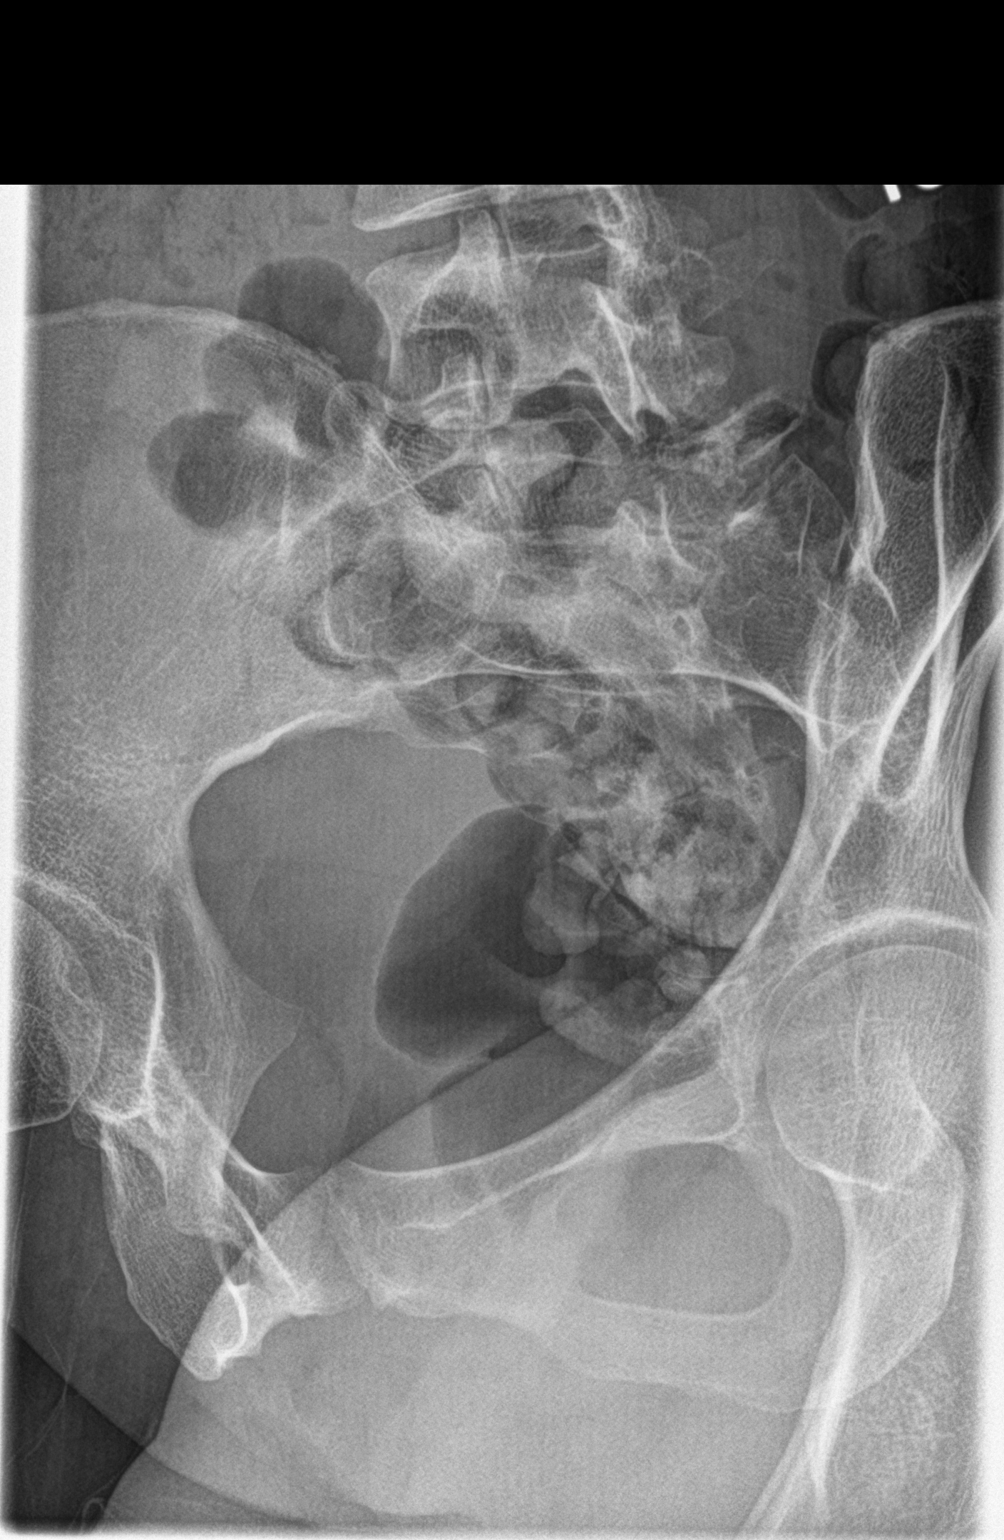
[im 2/3]
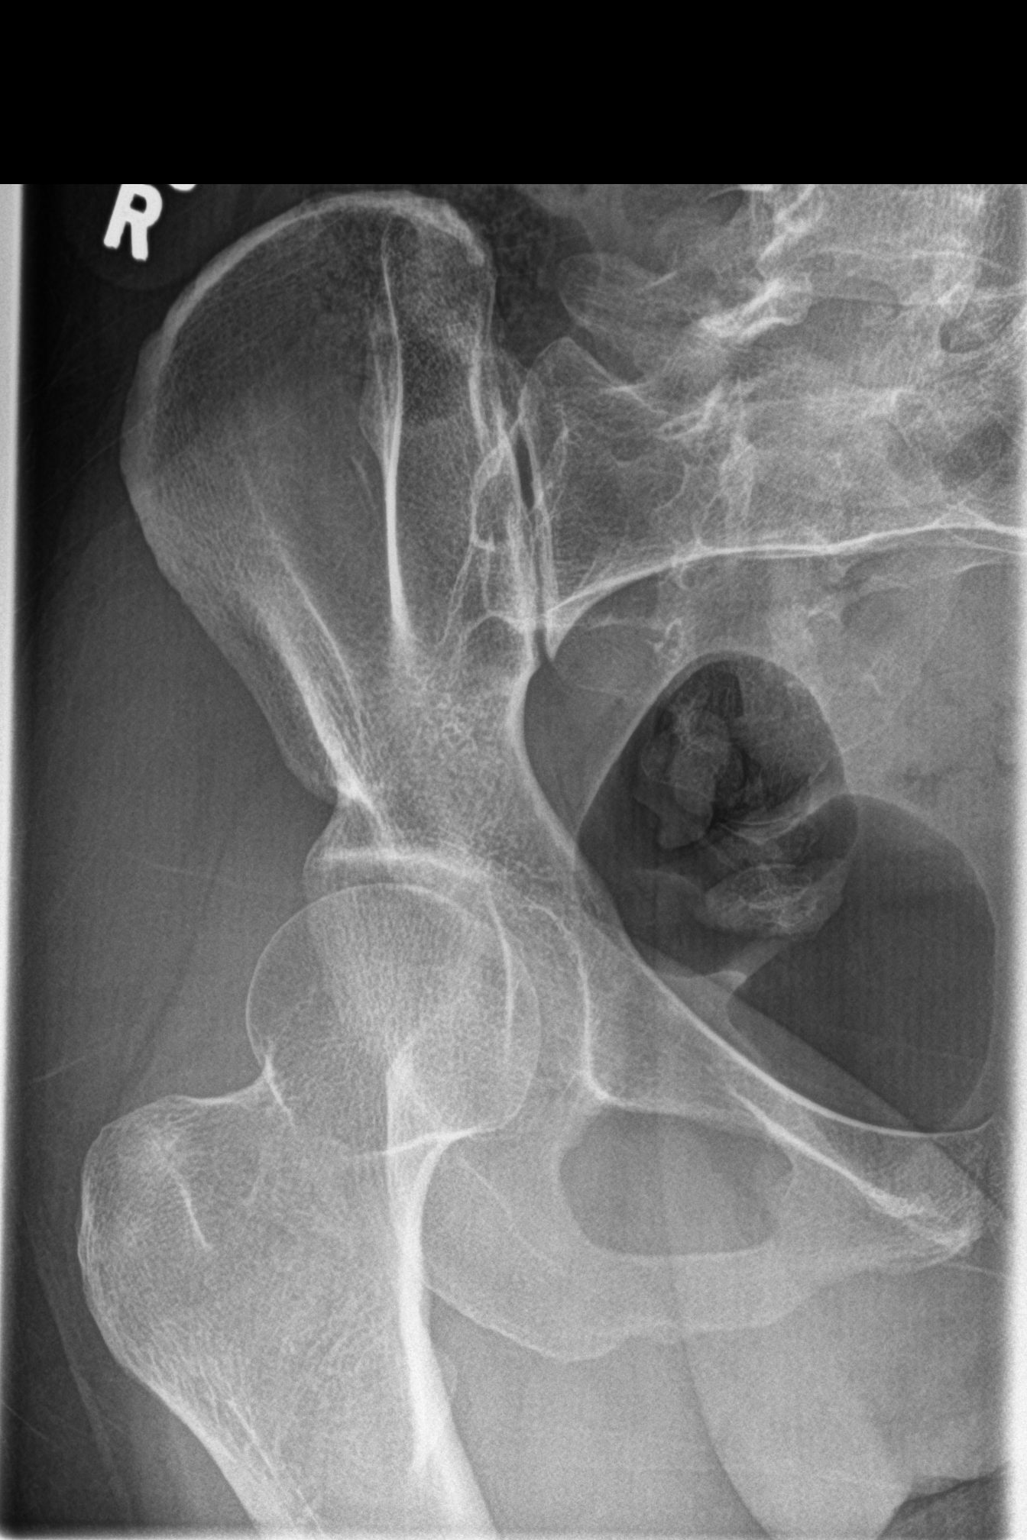
[im 3/3]
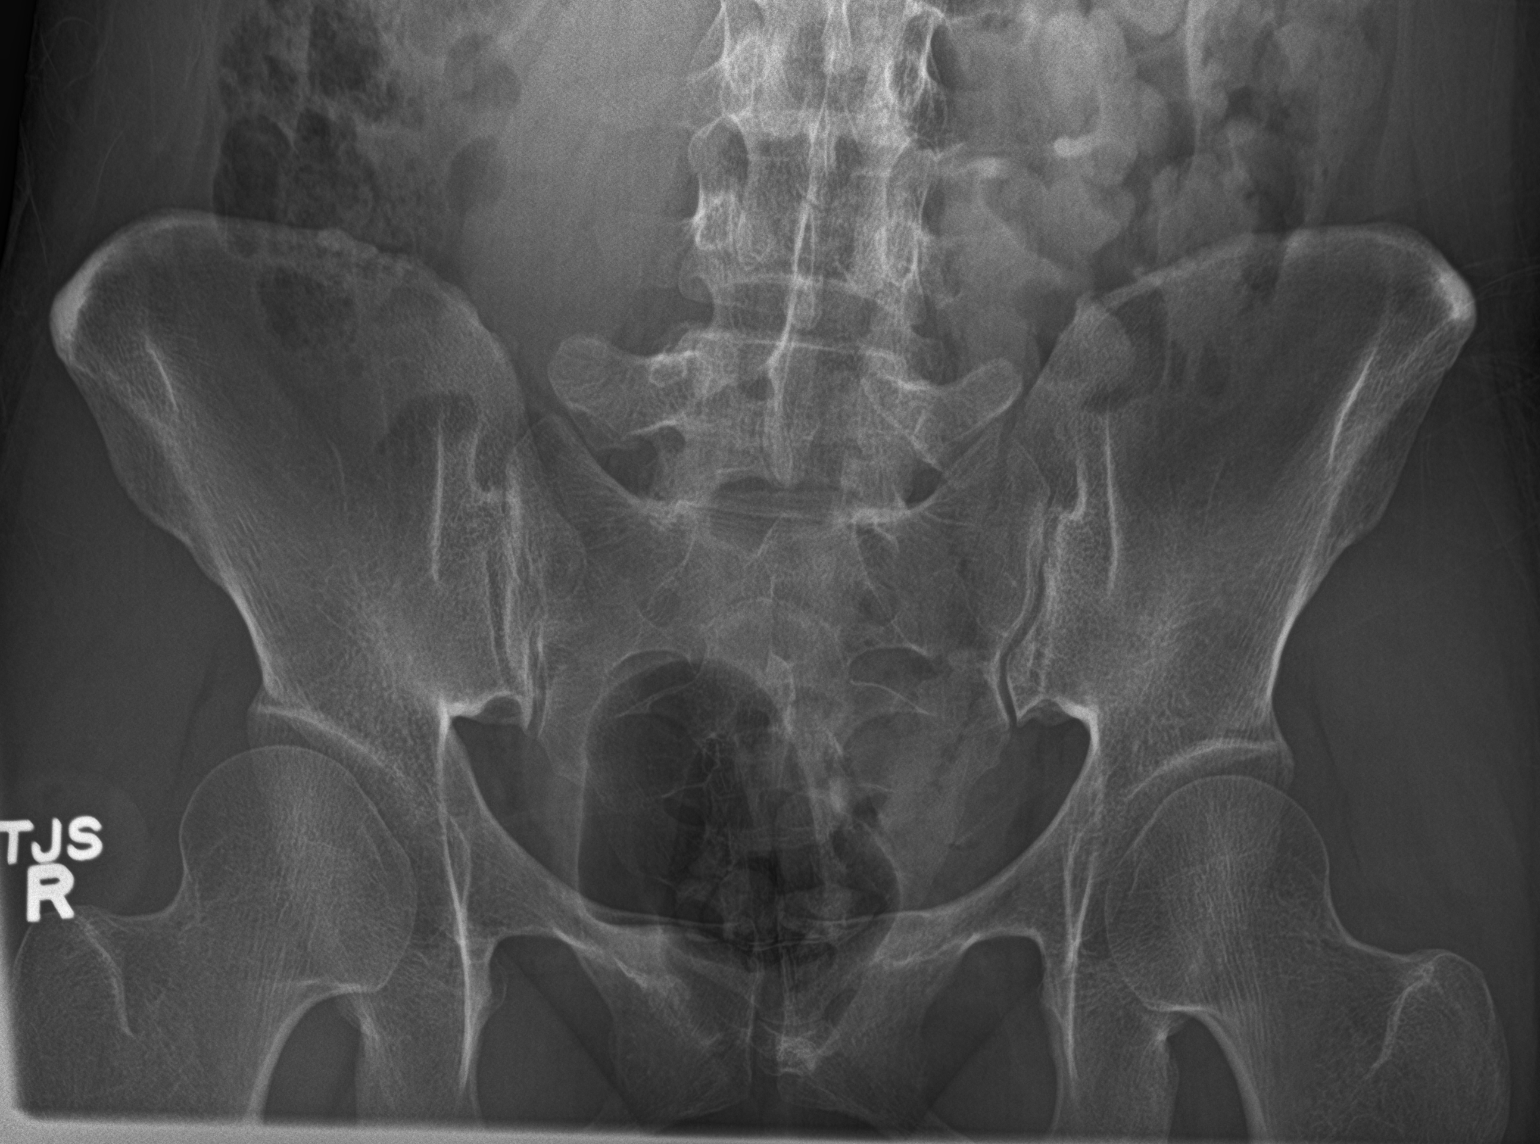

[3 of 3 positions shown; findings below may reference images not displayed]

FINDINGS: The sacroiliac joint spaces are maintained and there is no evidence
of arthropathy. No other bone abnormalities are seen.
IMPRESSION: Normal-appearing sacroiliac joints without abnormal fusion or
erosion.

## 2020-03-17 ENCOUNTER — Other Ambulatory Visit: Payer: Self-pay

## 2020-03-17 ENCOUNTER — Other Ambulatory Visit

## 2020-03-17 DIAGNOSIS — Z20822 Contact with and (suspected) exposure to covid-19: Secondary | ICD-10-CM

## 2020-03-19 LAB — NOVEL CORONAVIRUS, NAA: SARS-CoV-2, NAA: NOT DETECTED

## 2020-03-19 LAB — SARS-COV-2, NAA 2 DAY TAT

## 2020-04-02 ENCOUNTER — Ambulatory Visit: Admit: 2020-04-02 | Disposition: A | Discharge status: Home / Self Care

## 2020-09-08 ENCOUNTER — Telehealth: Payer: Self-pay | Admitting: Internal Medicine

## 2020-09-08 NOTE — Telephone Encounter (Signed)
Patient has been contacted at least 3 times for a recall, recall has been removed 

## 2020-09-08 NOTE — Telephone Encounter (Signed)
Pt returned call and would like to refuse future follow up at this time and would like not to be contacted for future appointments due to no issues.

## 2023-01-03 ENCOUNTER — Emergency Department
Admission: EM | Admit: 2023-01-03 | Discharge: 2023-01-03 | Disposition: A | Discharge status: Home / Self Care | Attending: Emergency Medicine | Admitting: Emergency Medicine

## 2023-01-03 ENCOUNTER — Other Ambulatory Visit: Payer: Self-pay

## 2023-01-03 ENCOUNTER — Emergency Department

## 2023-01-03 DIAGNOSIS — R0789 Other chest pain: Secondary | ICD-10-CM | POA: Insufficient documentation

## 2023-01-03 LAB — BASIC METABOLIC PANEL
Anion gap: 7 (ref 5–15)
BUN: 12 mg/dL (ref 6–20)
CO2: 26 mmol/L (ref 22–32)
Calcium: 9.3 mg/dL (ref 8.9–10.3)
Chloride: 106 mmol/L (ref 98–111)
Creatinine, Ser: 0.81 mg/dL (ref 0.44–1.00)
GFR, Estimated: >60 mL/min (ref >60)
Glucose, Bld: 80 mg/dL (ref 70–99)
Potassium: 3.5 mmol/L (ref 3.5–5.1)
Sodium: 139 mmol/L (ref 135–145)

## 2023-01-03 LAB — CBC
HCT: 41.3 % (ref 36.0–46.0)
Hemoglobin: 13.6 g/dL (ref 12.0–15.0)
MCH: 31.6 pg (ref 26.0–34.0)
MCHC: 32.9 g/dL (ref 30.0–36.0)
MCV: 96 fL (ref 80.0–100.0)
Platelets: 279 10*3/uL (ref 150–400)
RBC: 4.3 MIL/uL (ref 3.87–5.11)
RDW: 12.4 % (ref 11.5–15.5)
WBC: 6.9 10*3/uL (ref 4.0–10.5)
nRBC: 0 % (ref 0.0–0.2)

## 2023-01-03 LAB — TROPONIN I (HIGH SENSITIVITY): Troponin I (High Sensitivity): <2 ng/L (ref <18)

## 2023-01-03 MED ORDER — KETOROLAC TROMETHAMINE 30 MG/ML IJ SOLN
30.0000 mg | Freq: Once | INTRAMUSCULAR | Status: AC
  Administered 2023-01-03: 30 mg via INTRAMUSCULAR
  Filled 2023-01-03: qty 1

## 2023-01-03 MED ORDER — MELOXICAM 15 MG PO TABS: 15.0000 mg | ORAL_TABLET | Freq: Every day | ORAL | 0 refills | Status: DC

## 2023-01-03 MED ORDER — MELOXICAM 15 MG PO TABS: 15.0000 mg | ORAL_TABLET | Freq: Every day | ORAL | 0 refills | Status: AC

## 2023-01-03 MED ORDER — KETOROLAC TROMETHAMINE 10 MG PO TABS: 10.0000 mg | ORAL_TABLET | Freq: Four times a day (QID) | ORAL | 0 refills | Status: DC | PRN

## 2023-01-03 NOTE — ED Provider Notes (Signed)
[facility] Provider Note  Patient Contact: 10:27 PM (approximate)   History   Chest Pain   HPI  Paula Andrade is a 42 y.o. female who presents emergency department with sharp left-sided chest pain.  Patient states that she noticed it earlier today and it has been worsening.  It is reproducible with palpation, worse if she laughs, coughs, sneezes or positionally.  Patient does have a history of SVT and wanted to make sure this was not her heart.  She has not had any palpitations.  Patient denies any shortness of breath.  No GI symptoms.     Physical Exam   Triage Vital Signs: ED Triage Vitals  Enc Vitals Group     BP 01/03/23 1954 110/73     Pulse Rate 01/03/23 1954 65     Resp 01/03/23 1954 18     Temp 01/03/23 1954 98.6 F (37 C)     Temp Source 01/03/23 1954 Oral     SpO2 01/03/23 1954 100 %     Weight 01/03/23 1952 120 lb (54.4 kg)     Height 01/03/23 1952 5' (1.524 m)     Head Circumference --      Peak Flow --      Pain Score 01/03/23 1952 6     Pain Loc --      Pain Edu? --      Excl. in GC? --     Most recent vital signs: Vitals:   01/03/23 1954  BP: 110/73  Pulse: 65  Resp: 18  Temp: 98.6 F (37 C)  SpO2: 100%     General: Alert and in no acute distress.   Cardiovascular:  Good peripheral perfusion.  Normal S1 and S2 with no appreciable murmurs, rubs, gallops. Respiratory: Normal respiratory effort without tachypnea or retractions. Lungs CTAB. Good air entry to the bases with no decreased or absent breath sounds. Gastrointestinal: Bowel sounds 4 quadrants. Soft and nontender to palpation. No guarding or rigidity. No palpable masses. No distention. No CVA tenderness Musculoskeletal: Full range of motion to all extremities.  Palpation over the left chest wall reveals no palpable abnormality though patient is tender in the intercostal margins between the fifth and sixth rib region as well as along the left sternal border.  This  reproduces patient's pain. Neurologic:  No gross focal neurologic deficits are appreciated.  Skin:   No rash noted Other:   ED Results / Procedures / Treatments   Labs (all labs ordered are listed, but only abnormal results are displayed) Labs Reviewed  BASIC METABOLIC PANEL  CBC  TROPONIN I (HIGH SENSITIVITY)     EKG  ED ECG REPORT I, Delorise Royals Emaleigh Guimond,  personally viewed and interpreted this ECG.   Date: 01/03/2023  EKG Time: 1953 hrs.  Rate: 67 bpm  Rhythm: unchanged from previous tracings, normal sinus rhythm  Axis: Normal axis  Intervals:none  ST&T Change: No gross ST elevation or depression noted  Normal sinus rhythm.  No STEMI.  No significant change from previous EKG from 10/18/2016    RADIOLOGY  I personally viewed, evaluated, and interpreted these images as part of my medical decision making, as well as reviewing the written report by the radiologist.  ED Provider Interpretation: No acute cardiopulmonary findings on chest x-ray.  DG Chest 2 View  Result Date: 01/03/2023 CLINICAL DATA:  Left chest pain. EXAM: CHEST - 2 VIEW COMPARISON:  None Available. FINDINGS: The heart size and mediastinal contours are within normal  limits. Both lungs are clear. The visualized skeletal structures are unremarkable. IMPRESSION: No active cardiopulmonary disease. Electronically Signed   By: Burman Nieves M.D.   On: 01/03/2023 20:16    PROCEDURES:  Critical Care performed: No  Procedures   MEDICATIONS ORDERED IN ED: Medications  ketorolac (TORADOL) 30 MG/ML injection 30 mg (has no administration in time range)     IMPRESSION / MDM / ASSESSMENT AND PLAN / ED COURSE  I reviewed the triage vital signs and the nursing notes.                                 Differential diagnosis includes, but is not limited to, STEMI/ACS, costochondritis, rib fracture, pneumothorax, bronchitis, SVT, arrhythmia  Patient's presentation is most consistent with acute  presentation with potential threat to life or bodily function.   Patient's diagnosis is consistent with chest wall pain.  Patient presents emergency department complaining of left-sided chest pain.  Patient states that the pain began today.  She does have a history of SVT in the past that she has had no palpitations.  EKG revealed normal sinus rhythm with no evidence of STEMI.  Troponin was reassuring.  Labs are reassuring.  Patient has reproducible chest pain with palp patient.  Patient has no pain at rest, pain with laughing, coughing, sneezing or palpation.  At this time we will treat with anti-inflammatories for likely costochondral chest pain..  Concerning signs and symptoms and return precautions discussed with the patient.  Follow-up primary care as needed.  Patient is given ED precautions to return to the ED for any worsening or new symptoms.     FINAL CLINICAL IMPRESSION(S) / ED DIAGNOSES   Final diagnoses:  Chest wall pain     Rx / DC Orders   ED Discharge Orders          Ordered    ketorolac (TORADOL) 10 MG tablet  Every 6 hours PRN        01/03/23 2231    meloxicam (MOBIC) 15 MG tablet  Daily        01/03/23 2231             Note:  This document was prepared using Dragon voice recognition software and may include unintentional dictation errors.   Lanette Hampshire 01/03/23 2231    Chesley Noon, MD 01/03/23 2306

## 2023-01-03 NOTE — ED Triage Notes (Signed)
Pt to ED via POV c/o CP. Pt states it started this morning and has been getting progressively worse. Pain is left chest and pt described as stabbing pain. Does not radiate. Pain worsens whe deep inspiration. Hx of SVT. Denies SOB, fevers, N/V

## 2023-06-11 ENCOUNTER — Ambulatory Visit
Admission: EM | Admit: 2023-06-11 | Discharge: 2023-06-11 | Disposition: A | Discharge status: Home / Self Care | Attending: Emergency Medicine | Admitting: Emergency Medicine

## 2023-06-11 ENCOUNTER — Encounter: Payer: Self-pay | Admitting: Emergency Medicine

## 2023-06-11 DIAGNOSIS — S46811A Strain of other muscles, fascia and tendons at shoulder and upper arm level, right arm, initial encounter: Secondary | ICD-10-CM | POA: Diagnosis not present

## 2023-06-11 MED ORDER — PREDNISONE 20 MG PO TABS: 40.0000 mg | ORAL_TABLET | Freq: Every day | ORAL | 0 refills | Status: DC

## 2023-06-11 MED ORDER — CYCLOBENZAPRINE HCL 10 MG PO TABS: 10.0000 mg | ORAL_TABLET | Freq: Every day | ORAL | 0 refills | Status: DC

## 2023-06-11 MED ORDER — KETOROLAC TROMETHAMINE 30 MG/ML IJ SOLN
30.0000 mg | Freq: Once | INTRAMUSCULAR | Status: AC
  Administered 2023-06-11: 30 mg via INTRAMUSCULAR

## 2023-06-11 NOTE — ED Triage Notes (Signed)
Pt presents with right shoulder blade that radiates down her right arm x 2 weeks. 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 Pt has taken OTC pain medication for the pain.

## 2023-06-11 NOTE — Discharge Instructions (Signed)
Your pain is most likely caused by irritation to the muscles.  You have been given Toradol which helps to reduce inflammation and ideally will improve some of your symptoms in 30 minutes and her  Starting tomorrow take prednisone with food as directed to continue the above process, may take Tylenol or any topical medicines while using this  May use muscle relaxant at bedtime to help get you comfortable and allow you to rest, may use as needed  You may use heating pad in 15 minute intervals as needed for additional comfort,or you may find comfort in using ice in 10-15 minutes over affected area  Begin massaging or stretching affected area daily for 10 minutes as tolerated to further loosen muscles   When sitting or lying down place pillow underneath arm and behind back  Practice good posture: head back, shoulders back, chest forward, pelvis back and weight distributed evenly on both legs  If pain persist after recommended treatment or reoccurs if may be beneficial to follow up with orthopedic specialist for evaluation, this doctor specializes in the bones and can manage your symptoms long-term with options such as but not limited to imaging, medications or physical therapy

## 2023-06-11 NOTE — ED Provider Notes (Signed)
[facility]    CSN: [id number] Arrival date & time: 06/11/23  1231      History   Chief Complaint Chief Complaint  Patient presents with   Shoulder Pain    HPI Paula Andrade is a 42 y.o. female.   Patient presents for evaluation of pain to the right show reported present for 2 weeks.  Radiates numbness and tingling into the right arm extending into the wrist.  Symptoms are exacerbated when turning the head towards the right side and when lying.  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  Has attempted use of Tylenol and Motrin which has been ineffective.  Denies direct injury or trauma .  Past Medical History:  Diagnosis Date   GERD (gastroesophageal reflux disease)    Tachycardia     There are no problems to display for this patient.   Past Surgical History:  Procedure Laterality Date   CESAREAN SECTION      OB History   No obstetric history on file.      Home Medications    Prior to Admission medications   Medication Sig Start Date End Date Taking? Authorizing Provider  TRINESSA LO 0.18/0.215/0.25 MG-25 MCG tab Take 1 tablet by mouth daily. 09/08/16  Yes [provider]  cetirizine (ZYRTEC) 10 MG chewable tablet Chew 10 mg by mouth as needed for allergies.    [provider]  ketorolac (TORADOL) 10 MG tablet Take 1 tablet (10 mg total) by mouth every 6 (six) hours as needed. 01/03/23   Cuthriell, Delorise Royals, PA-C  meloxicam (MOBIC) 15 MG tablet Take 1 tablet (15 mg total) by mouth daily. Take after finishing the toradol tablets if you still have symptoms 01/03/23 01/03/24  Cuthriell, Delorise Royals, PA-C  metoprolol tartrate (LOPRESSOR) 25 MG tablet Take 1/2 tablet (12.5 mg) PO as needed every 4 hours if you develop palpitations/SVT Patient not taking: Reported on 12/28/2016 10/18/16 11/07/16  Loleta Rose, MD  predniSONE (DELTASONE) 10 MG tablet Take 1 tablet (10 mg total) by mouth daily. 6,5,4,3,2,1 six day taper 07/08/18    Evon Slack, PA-C    Family History Family History  Adopted: Yes    Social History Social History   Tobacco Use   Smoking status: Former    Types: Cigarettes   Smokeless tobacco: Never  Vaping Use   Vaping status: Never Used  Substance Use Topics   Alcohol use: Yes    Comment: occassional   Drug use: No     Allergies   Patient has no known allergies.   Review of Systems Review of Systems   Physical Exam Triage Vital Signs ED Triage Vitals  Encounter Vitals Group     BP 06/11/23 1301 96/64     Systolic BP Percentile --      Diastolic BP Percentile --      Pulse Rate 06/11/23 1301 64     Resp 06/11/23 1301 16     Temp 06/11/23 1301 98.3 F (36.8 C)     Temp Source 06/11/23 1301 Oral     SpO2 06/11/23 1301 98 %     Weight --      Height --      Head Circumference --      Peak Flow --      Pain Score 06/11/23 1300 8     Pain Loc --      Pain Education --      Exclude from Growth Chart --  No data found.  Updated Vital Signs BP 96/64 (BP Location: Left Arm)   Pulse 64   Temp 98.3 F (36.8 C) (Oral)   Resp 16   LMP 05/25/2023   SpO2 98%   Visual Acuity Right Eye Distance:   Left Eye Distance:   Bilateral Distance:    Right Eye Near:   Left Eye Near:    Bilateral Near:     Physical Exam Constitutional:      Appearance: Normal appearance.  Eyes:     Extraocular Movements: Extraocular movements intact.  Pulmonary:     Effort: Pulmonary effort is normal.  Musculoskeletal:       Back:     Comments: Tenderness is present to the center of the right trapezius without ecchymosis swelling or deformity, no involvement of the shoulder or neck, able to complete full range of motion of the right arm, able to twist turn  Neurological:     Mental Status: She is alert and oriented to person, place, and time. Mental status is at baseline.      UC Treatments / Results  Labs (all labs ordered are listed, but only abnormal results are  displayed) Labs Reviewed - No data to display  EKG   Radiology No results found.  Procedures Procedures (including critical care time)  Medications Ordered in UC Medications - No data to display  Initial Impression / Assessment and Plan / UC Course  I have reviewed the triage vital signs and the nursing notes.  Pertinent labs & imaging results that were available during my care of the patient were reviewed by me and considered in my medical decision making (see chart for details).  Right trapezius strain  Etiology most likely muscular, discussed with patient, Toradol IM given and prescribed prednisone and Flexeril for outpatient use recommended RICE heat massage stretching and activity as tolerated advised follow-up with orthopedics if symptoms continue to persist or worsen Final Clinical Impressions(s) / UC Diagnoses   Final diagnoses:  None   Discharge Instructions   None    ED Prescriptions   None    PDMP not reviewed this encounter.   Marykaye, Kunzler, NP 06/11/23 1349

## 2023-06-19 ENCOUNTER — Ambulatory Visit: Admission: EM | Admit: 2023-06-19 | Discharge: 2023-06-19 | Disposition: A | Discharge status: Home / Self Care

## 2023-06-19 DIAGNOSIS — M898X1 Other specified disorders of bone, shoulder: Secondary | ICD-10-CM

## 2023-06-19 DIAGNOSIS — M5410 Radiculopathy, site unspecified: Secondary | ICD-10-CM | POA: Diagnosis not present

## 2023-06-19 NOTE — ED Triage Notes (Signed)
Pt c/o R shoulder blade pain x1 wk. Was seen on 11/18 for the same issue. Was given flexeril & prednisone w/o relief.

## 2023-06-19 NOTE — Discharge Instructions (Addendum)
Rest,ice,use over the counter lidocaine patches, continue flexeril as prescribed. Follow up with Orthopedics. May take over the counter tylenol/ibuprofen as label directed.   [facility]: [address] [place], [place] [address] Phone: [phone] Urgent care hours 8a-7:30p Mon-Sat

## 2023-06-19 NOTE — ED Provider Notes (Signed)
[facility]    CSN: [id number] Arrival date & time: 06/19/23  1556      History   Chief Complaint Chief Complaint  Patient presents with   Shoulder Pain    HPI Paula Andrade is a 42 y.o. female.   42 year old female, Paula Andrade, presents to urgent care for re evaluation of right shoulder pain x 3 weeks ,was seen previously for same on 11/18 and treated with prednisone and Flexeril, pt was referred to Orthopedics if symptom persist. Pt states she got no relief w prednisone and flexeril and has not followed up with Orthopedics, pt states when she works, her right arm goes numb from arm to right thumb and index finger. Pt also reports pain is reproducible with palpation of right scapula.  The history is provided by the patient. No language interpreter was used.    Past Medical History:  Diagnosis Date   GERD (gastroesophageal reflux disease)    Tachycardia     Patient Active Problem List   Diagnosis Date Noted   Pain of right scapula 06/19/2023   Radiculopathy 06/19/2023    Past Surgical History:  Procedure Laterality Date   CESAREAN SECTION      OB History   No obstetric history on file.      Home Medications    Prior to Admission medications   Medication Sig Start Date End Date Taking? Authorizing Provider  cetirizine (ZYRTEC) 10 MG chewable tablet Chew 10 mg by mouth as needed for allergies.   Yes [provider]  cyclobenzaprine (FLEXERIL) 10 MG tablet Take 1 tablet (10 mg total) by mouth at bedtime. 06/11/23  Yes Caroll, Adrienne R, NP  ketorolac (TORADOL) 10 MG tablet Take 1 tablet (10 mg total) by mouth every 6 (six) hours as needed. 01/03/23  Yes Cuthriell, Delorise Royals, PA-C  meloxicam (MOBIC) 15 MG tablet Take 1 tablet (15 mg total) by mouth daily. Take after finishing the toradol tablets if you still have symptoms 01/03/23 01/03/24 Yes Cuthriell, Delorise Royals, PA-C  TRINESSA LO 0.18/0.215/0.25 MG-25 MCG tab Take 1 tablet by mouth  daily. 09/08/16  Yes [provider]  metoprolol tartrate (LOPRESSOR) 25 MG tablet Take 1/2 tablet (12.5 mg) PO as needed every 4 hours if you develop palpitations/SVT Patient not taking: Reported on 12/28/2016 10/18/16 11/07/16  Loleta Rose, MD  predniSONE (DELTASONE) 20 MG tablet Take 2 tablets (40 mg total) by mouth daily. 06/11/23   Valinda Hoar, NP    Family History Family History  Adopted: Yes    Social History Social History   Tobacco Use   Smoking status: Former    Types: Cigarettes   Smokeless tobacco: Never  Vaping Use   Vaping status: Never Used  Substance Use Topics   Alcohol use: Yes    Comment: occassional   Drug use: No     Allergies   Patient has no known allergies.   Review of Systems Review of Systems  Musculoskeletal:  Positive for myalgias.  Skin: Negative.   All other systems reviewed and are negative.    Physical Exam Triage Vital Signs ED Triage Vitals  Encounter Vitals Group     BP 06/19/23 1607 97/76     Systolic BP Percentile --      Diastolic BP Percentile --      Pulse Rate 06/19/23 1607 61     Resp 06/19/23 1607 16     Temp 06/19/23 1607 98.4 F (36.9 C)  Temp Source 06/19/23 1607 Oral     SpO2 06/19/23 1607 95 %     Weight 06/19/23 1607 115 lb (52.2 kg)     Height 06/19/23 1607 5' (1.524 m)     Head Circumference --      Peak Flow --      Pain Score 06/19/23 1610 8     Pain Loc --      Pain Education --      Exclude from Growth Chart --    No data found.  Updated Vital Signs BP 97/76 (BP Location: Left Arm)   Pulse 61   Temp 98.4 F (36.9 C) (Oral)   Resp 16   Ht 5' (1.524 m)   Wt 115 lb (52.2 kg)   LMP 05/25/2023   SpO2 95%   BMI 22.46 kg/m   Visual Acuity Right Eye Distance:   Left Eye Distance:   Bilateral Distance:    Right Eye Near:   Left Eye Near:    Bilateral Near:     Physical Exam Vitals and nursing note reviewed.  Cardiovascular:     Rate and Rhythm: Normal rate.     Pulses:           Radial pulses are 2+ on the right side and 2+ on the left side.  Musculoskeletal:       Arms:  Neurological:     General: No focal deficit present.     Mental Status: She is alert and oriented to person, place, and time.     GCS: GCS eye subscore is 4. GCS verbal subscore is 5. GCS motor subscore is 6.     Cranial Nerves: No cranial nerve deficit.     Sensory: No sensory deficit.     Motor: Motor function is intact.     Coordination: Coordination is intact.     Gait: Gait is intact.     Comments: Handgrips equal bilaterally, 5/5 strength  Psychiatric:        Attention and Perception: Attention normal.        Mood and Affect: Mood normal.        Speech: Speech normal.        Behavior: Behavior normal.      UC Treatments / Results  Labs (all labs ordered are listed, but only abnormal results are displayed) Labs Reviewed - No data to display  EKG   Radiology No results found.  Procedures Procedures (including critical care time)  Medications Ordered in UC Medications - No data to display  Initial Impression / Assessment and Plan / UC Course  I have reviewed the triage vital signs and the nursing notes.  Pertinent labs & imaging results that were available during my care of the patient were reviewed by me and considered in my medical decision making (see chart for details).    Discussed exam findings and plan of care with patient, strict go to ER precautions given.   Patient verbalized understanding to this provider.  Ddx: Pain of right scapula, radiculopathy, trigger point, muscle spasm Final Clinical Impressions(s) / UC Diagnoses   Final diagnoses:  Pain of right scapula  Radiculopathy, unspecified spinal region     Discharge Instructions      Rest,ice,use over the counter lidocaine patches, continue flexeril as prescribed. Follow up with Orthopedics. May take over the counter tylenol/ibuprofen as label directed.   [facility]: [address] [place], [place] [address] Phone: [phone] Urgent care hours 8a-7:30p Mon-Sat  ED Prescriptions   None    PDMP not reviewed this encounter.   Clancy Gourd, NP 06/19/23 1711

## 2023-08-28 ENCOUNTER — Telehealth: Payer: Self-pay | Admitting: Emergency Medicine

## 2023-08-28 ENCOUNTER — Encounter: Payer: Self-pay | Admitting: Emergency Medicine

## 2023-08-28 ENCOUNTER — Ambulatory Visit
Admission: EM | Admit: 2023-08-28 | Discharge: 2023-08-28 | Disposition: A | Discharge status: Home / Self Care | Attending: Emergency Medicine | Admitting: Emergency Medicine

## 2023-08-28 DIAGNOSIS — U071 COVID-19: Secondary | ICD-10-CM | POA: Insufficient documentation

## 2023-08-28 LAB — GROUP A STREP BY PCR: Group A Strep by PCR: NOT DETECTED

## 2023-08-28 LAB — RESP PANEL BY RT-PCR (FLU A&B, COVID) ARPGX2
Influenza A by PCR: NEGATIVE
Influenza B by PCR: NEGATIVE
SARS Coronavirus 2 by RT PCR: POSITIVE — AB

## 2023-08-28 MED ORDER — IPRATROPIUM BROMIDE 0.06 % NA SOLN: 2.0000 | Freq: Four times a day (QID) | NASAL | 12 refills | Status: DC

## 2023-08-28 MED ORDER — BENZONATATE 100 MG PO CAPS: 200.0000 mg | ORAL_CAPSULE | Freq: Three times a day (TID) | ORAL | 0 refills | Status: DC

## 2023-08-28 MED ORDER — PROMETHAZINE-DM 6.25-15 MG/5ML PO SYRP: 5.0000 mL | ORAL_SOLUTION | Freq: Four times a day (QID) | ORAL | 0 refills | Status: DC | PRN

## 2023-08-28 NOTE — ED Provider Notes (Signed)
 [facility]    CSN: [id number] Arrival date & time: 08/28/23  1528      History   Chief Complaint Chief Complaint  Patient presents with   Sore Throat   Nasal Congestion   Fever    HPI Paula Andrade is a 43 y.o. female.   HPI  43 year old female with past medical history significant for tachycardia and GERD presents for evaluation of respiratory symptoms that started 2 days ago.  These include fever with a Tmax 100, runny nose and nasal congestion, sore throat, and a nonproductive cough.  She denies any ear pain, shortness breath, or wheezing.  No known sick contacts or recent travel.  Past Medical History:  Diagnosis Date   GERD (gastroesophageal reflux disease)    Tachycardia     Patient Active Problem List   Diagnosis Date Noted   Pain of right scapula 06/19/2023   Radiculopathy 06/19/2023    Past Surgical History:  Procedure Laterality Date   CESAREAN SECTION      OB History   No obstetric history on file.      Home Medications    Prior to Admission medications   Medication Sig Start Date End Date Taking? Authorizing Provider  benzonatate  (TESSALON ) 100 MG capsule Take 2 capsules (200 mg total) by mouth every 8 (eight) hours. 08/28/23  Yes Bernardino Ditch, NP  ipratropium (ATROVENT ) 0.06 % nasal spray Place 2 sprays into both nostrils 4 (four) times daily. 08/28/23  Yes Bernardino Ditch, NP  promethazine -dextromethorphan (PROMETHAZINE -DM) 6.25-15 MG/5ML syrup Take 5 mLs by mouth 4 (four) times daily as needed. 08/28/23  Yes Bernardino Ditch, NP  TRINESSA LO 0.18/0.215/0.25 MG-25 MCG tab Take 1 tablet by mouth daily. 09/08/16  Yes [provider]  cetirizine (ZYRTEC) 10 MG chewable tablet Chew 10 mg by mouth as needed for allergies.    [provider]  cyclobenzaprine  (FLEXERIL ) 10 MG tablet Take 1 tablet (10 mg total) by mouth at bedtime. 06/11/23   Kitchens, Shelba SAUNDERS, NP  ketorolac  (TORADOL ) 10 MG tablet Take 1 tablet (10 mg total) by mouth  every 6 (six) hours as needed. 01/03/23   Cuthriell, Dorn BIRCH, PA-C  meloxicam  (MOBIC ) 15 MG tablet Take 1 tablet (15 mg total) by mouth daily. Take after finishing the toradol  tablets if you still have symptoms 01/03/23 01/03/24  Cuthriell, Dorn BIRCH, PA-C  metoprolol  tartrate (LOPRESSOR ) 25 MG tablet Take 1/2 tablet (12.5 mg) PO as needed every 4 hours if you develop palpitations/SVT Patient not taking: Reported on 12/28/2016 10/18/16 11/07/16  Gordan Huxley, MD  predniSONE  (DELTASONE ) 20 MG tablet Take 2 tablets (40 mg total) by mouth daily. 06/11/23   Pizza Shelba SAUNDERS, NP    Family History Family History  Adopted: Yes    Social History Social History   Tobacco Use   Smoking status: Former    Types: Cigarettes   Smokeless tobacco: Never  Vaping Use   Vaping status: Never Used  Substance Use Topics   Alcohol use: Yes    Comment: occassional   Drug use: No     Allergies   Patient has no known allergies.   Review of Systems Review of Systems  Constitutional:  Positive for fever.  HENT:  Positive for congestion, rhinorrhea and sore throat. Negative for ear pain.   Respiratory:  Positive for cough. Negative for shortness of breath and wheezing.      Physical Exam Triage Vital Signs ED Triage Vitals [08/28/23 1615]  Encounter Vitals Group  BP      Systolic BP Percentile      Diastolic BP Percentile      Pulse      Resp      Temp      Temp src      SpO2      Weight      Height      Head Circumference      Peak Flow      Pain Score 5     Pain Loc      Pain Education      Exclude from Growth Chart    No data found.  Updated Vital Signs BP 100/62 (BP Location: Left Arm)   Pulse 86   Temp 98.5 F (36.9 C) (Oral)   Resp 18   LMP 08/16/2023   SpO2 98%   Visual Acuity Right Eye Distance:   Left Eye Distance:   Bilateral Distance:    Right Eye Near:   Left Eye Near:    Bilateral Near:     Physical Exam Vitals and nursing note reviewed.   Constitutional:      Appearance: Normal appearance. She is not ill-appearing.  HENT:     Head: Normocephalic and atraumatic.     Right Ear: Tympanic membrane, ear canal and external ear normal. There is no impacted cerumen.     Left Ear: Tympanic membrane, ear canal and external ear normal. There is no impacted cerumen.     Nose: Congestion and rhinorrhea present.     Comments: Nasal mucosa is erythematous and edematous with clear discharge in both nares.    Mouth/Throat:     Mouth: Mucous membranes are moist.     Pharynx: Oropharynx is clear. Posterior oropharyngeal erythema present. No oropharyngeal exudate.     Comments: Mild symptoms are 1+ edematous and erythematous and there is erythema to the soft palate.  No exudate. Cardiovascular:     Rate and Rhythm: Normal rate and regular rhythm.     Pulses: Normal pulses.     Heart sounds: Normal heart sounds. No murmur heard.    No friction rub. No gallop.  Pulmonary:     Effort: Pulmonary effort is normal.     Breath sounds: Normal breath sounds. No wheezing, rhonchi or rales.  Musculoskeletal:     Cervical back: Normal range of motion and neck supple. No tenderness.  Lymphadenopathy:     Cervical: No cervical adenopathy.  Skin:    General: Skin is warm and dry.     Capillary Refill: Capillary refill takes less than 2 seconds.     Findings: No lesion.  Neurological:     General: No focal deficit present.     Mental Status: She is alert and oriented to person, place, and time.      UC Treatments / Results  Labs (all labs ordered are listed, but only abnormal results are displayed) Labs Reviewed  RESP PANEL BY RT-PCR (FLU A&B, COVID) ARPGX2 - Abnormal; Notable for the following components:      Result Value   SARS Coronavirus 2 by RT PCR POSITIVE (*)    All other components within normal limits  GROUP A STREP BY PCR    EKG   Radiology No results found.  Procedures Procedures (including critical care  time)  Medications Ordered in UC Medications - No data to display  Initial Impression / Assessment and Plan / UC Course  I have reviewed the triage vital signs and the nursing notes.  Pertinent labs & imaging results that were available during my care of the patient were reviewed by me and considered in my medical decision making (see chart for details).   Patient is a nontoxic-appearing 43 year old female pending for evaluation of respiratory symptoms as outlined HPI above.  Her mother significant symptom appears to be a sore throat as she is having pain with swallowing.  Her exam does reveal 1+ edema and erythema of her tonsillar pillars as well as erythema to the soft palate but no appreciable exudate.  Remainder of her upper respiratory tract does reveal inflammation with clear rhinorrhea and clear postnasal drip.  Her cardiopulmonary exam is benign.  Differential diagnosis include COVID, influenza, strep, viral illness.  I will order a COVID, flu, and strep PCR.  Strep PCR is negative.  Respiratory panel was positive for COVID.  I will discharge patient on the diagnosis of COVID-19 and prescribe Atrovent  nasal spray help the nasal congestion along with Tessalon  Perles and Promethazine  DM cough syrup for cough and congestion.  She may use over-the-counter Tylenol and/or ibuprofen according to pack instructions as needed for any fever or pain.  Return and ER precautions reviewed.  Work note provided.   Final Clinical Impressions(s) / UC Diagnoses   Final diagnoses:  COVID-19     Discharge Instructions      CDC guidelines state that you must wear a mask for the first 5 days of symptoms when you are around other people.  After 5 days you no longer need to mask as you are no longer considered infectious.  There is no longer need to quarantine unless you have a fever.  If you do have a fever then you need to quarantine until you have been fever free for 24 hours without taking Tylenol  and/or ibuprofen.  Use over-the-counter Tylenol and/or ibuprofen according to the package instructions as needed for fever and pain.  Use the Atrovent  nasal spray, 2 squirts up each nostril every 6 hours, as needed for nasal congestion and runny nose.  Use the Tessalon  Perles every 8 hours during the day as needed for cough.  Take them with a small sip of water.  You may experience numbness to the base of your tongue or metallic taste in her mouth, this is normal.  Use the Promethazine  DM cough syrup at bedtime as needed for cough and congestion.  Be mindful this medication will make you sleepy.  If you develop any worsening respiratory symptoms such as shortness of breath, shortness of breath at rest, feel as though you cannot catch your breath, you are unable to speak in full sentences, or, as a late sign, your lips begin turning blue you need to call 911 and go to the ER for evaluation.      ED Prescriptions     Medication Sig Dispense Auth. Provider   benzonatate  (TESSALON ) 100 MG capsule Take 2 capsules (200 mg total) by mouth every 8 (eight) hours. 21 capsule Bernardino Ditch, NP   ipratropium (ATROVENT ) 0.06 % nasal spray Place 2 sprays into both nostrils 4 (four) times daily. 15 mL Bernardino Ditch, NP   promethazine -dextromethorphan (PROMETHAZINE -DM) 6.25-15 MG/5ML syrup Take 5 mLs by mouth 4 (four) times daily as needed. 118 mL Bernardino Ditch, NP      PDMP not reviewed this encounter.   Bernardino Ditch, NP 08/28/23 [phone]

## 2023-08-28 NOTE — Telephone Encounter (Signed)
Pt at the pharmacy and one of prescriptions from today's visit is not showing up. I resent benzonatate 100 mg to [facility]

## 2023-08-28 NOTE — ED Triage Notes (Signed)
Pt presents with a sore throat, fever and congestion x 2 days.

## 2023-08-28 NOTE — Discharge Instructions (Signed)
 CDC guidelines state that you must wear a mask for the first 5 days of symptoms when you are around other people.  After 5 days you no longer need to mask as you are no longer considered infectious.  There is no longer need to quarantine unless you have a fever.  If you do have a fever then you need to quarantine until you have been fever free for 24 hours without taking Tylenol and/or ibuprofen.  Use over-the-counter Tylenol and/or ibuprofen according to the package instructions as needed for fever and pain.  Use the Atrovent nasal spray, 2 squirts up each nostril every 6 hours, as needed for nasal congestion and runny nose.  Use the Tessalon Perles every 8 hours during the day as needed for cough.  Take them with a small sip of water.  You may experience numbness to the base of your tongue or metallic taste in her mouth, this is normal.  Use the Promethazine DM cough syrup at bedtime as needed for cough and congestion.  Be mindful this medication will make you sleepy.  If you develop any worsening respiratory symptoms such as shortness of breath, shortness of breath at rest, feel as though you cannot catch your breath, you are unable to speak in full sentences, or, as a late sign, your lips begin turning blue you need to call 911 and go to the ER for evaluation.

## 2024-01-10 ENCOUNTER — Encounter: Payer: Self-pay | Admitting: Emergency Medicine

## 2024-01-10 ENCOUNTER — Ambulatory Visit
Admission: EM | Admit: 2024-01-10 | Discharge: 2024-01-10 | Disposition: A | Discharge status: Home / Self Care | Attending: Family Medicine | Admitting: Family Medicine

## 2024-01-10 DIAGNOSIS — N3001 Acute cystitis with hematuria: Secondary | ICD-10-CM | POA: Insufficient documentation

## 2024-01-10 LAB — URINALYSIS, W/ REFLEX TO CULTURE (INFECTION SUSPECTED)
Bilirubin Urine: NEGATIVE
Glucose, UA: NEGATIVE mg/dL
Ketones, ur: NEGATIVE mg/dL
Nitrite: NEGATIVE
Protein, ur: NEGATIVE mg/dL
Specific Gravity, Urine: 1.015 (ref 1.005–1.030)
pH: 7 (ref 5.0–8.0)

## 2024-01-10 MED ORDER — NITROFURANTOIN MONOHYD MACRO 100 MG PO CAPS: 100.0000 mg | ORAL_CAPSULE | Freq: Two times a day (BID) | ORAL | 0 refills | Status: DC

## 2024-01-10 NOTE — ED Provider Notes (Incomplete)
 [facility]    CSN: [id number] Arrival date & time: 01/10/24  1546      History   Chief Complaint Chief Complaint  Patient presents with   Dysuria     HPI HPI Paula Andrade is a 43 y.o. female.    Paula Andrade presents for dysuria for the past week.  Tried drinking more water prior to arrival.  Has  not had any antibiotics in last 30 days. Denies known STI exposure.  Paula Andrade does not use condoms regularly. She is  not currently pregnant.  Patient's last menstrual period was 01/02/2024.  Takes COCs.    - Abnormal vaginal discharge: no - vaginal odor: no - vaginal bleeding: no - Dysuria: yes  - Hematuria: no - Urinary urgency: no - Urinary frequency: yes  - Fever: no - Abdominal pain: no  - Pelvic pain: no - Rash/Skin lesions/mouth ulcers: no - Nausea: no  - Vomiting: no  - Back Pain: no        Past Medical History:  Diagnosis Date   GERD (gastroesophageal reflux disease)    Tachycardia     Patient Active Problem List   Diagnosis Date Noted   Pain of right scapula 06/19/2023   Radiculopathy 06/19/2023    Past Surgical History:  Procedure Laterality Date   CESAREAN SECTION      OB History   No obstetric history on file.      Home Medications    Prior to Admission medications   Medication Sig Start Date End Date Taking? Authorizing Provider  benzonatate  (TESSALON ) 100 MG capsule Take 2 capsules (200 mg total) by mouth every 8 (eight) hours. 08/28/23   Kent Pear, NP  cetirizine (ZYRTEC) 10 MG chewable tablet Chew 10 mg by mouth as needed for allergies.    [provider]  cyclobenzaprine  (FLEXERIL ) 10 MG tablet Take 1 tablet (10 mg total) by mouth at bedtime. 06/11/23   Bowron, Adrienne R, NP  ipratropium (ATROVENT ) 0.06 % nasal spray Place 2 sprays into both nostrils 4 (four) times daily. 08/28/23   Kent Pear, NP  ketorolac  (TORADOL ) 10 MG tablet Take 1 tablet (10 mg total) by mouth every 6 (six) hours as needed.  01/03/23   Cuthriell, Jonathan D, PA-C  metoprolol  tartrate (LOPRESSOR ) 25 MG tablet Take 1/2 tablet (12.5 mg) PO as needed every 4 hours if you develop palpitations/SVT Patient not taking: Reported on 12/28/2016 10/18/16 11/07/16  Lynnda Sas, MD  predniSONE  (DELTASONE ) 20 MG tablet Take 2 tablets (40 mg total) by mouth daily. 06/11/23   Reena Canning, NP  promethazine -dextromethorphan (PROMETHAZINE -DM) 6.25-15 MG/5ML syrup Take 5 mLs by mouth 4 (four) times daily as needed. 08/28/23   Kent Pear, NP  TRINESSA LO 0.18/0.215/0.25 MG-25 MCG tab Take 1 tablet by mouth daily. 09/08/16   [provider]    Family History Family History  Adopted: Yes    Social History Social History   Tobacco Use   Smoking status: Former    Types: Cigarettes   Smokeless tobacco: Never  Vaping Use   Vaping status: Never Used  Substance Use Topics   Alcohol use: Yes    Comment: occassional   Drug use: No     Allergies   Patient has no known allergies.   Review of Systems Review of Systems: :negative unless otherwise stated in HPI.      Physical Exam Triage Vital Signs ED Triage Vitals  Encounter Vitals Group     BP 01/10/24 1617 116/77  Girls Systolic BP Percentile --      Girls Diastolic BP Percentile --      Boys Systolic BP Percentile --      Boys Diastolic BP Percentile --      Pulse Rate 01/10/24 1617 71     Resp 01/10/24 1617 16     Temp 01/10/24 1617 98.8 F (37.1 C)     Temp Source 01/10/24 1617 Oral     SpO2 01/10/24 1617 97 %     Weight --      Height --      Head Circumference --      Peak Flow --      Pain Score 01/10/24 1615 4     Pain Loc --      Pain Education --      Exclude from Growth Chart --    No data found.  Updated Vital Signs BP 116/77 (BP Location: Right Arm)   Pulse 71   Temp 98.8 F (37.1 C) (Oral)   Resp 16   LMP 01/02/2024   SpO2 97%   Visual Acuity Right Eye Distance:   Left Eye Distance:   Bilateral Distance:    Right  Eye Near:   Left Eye Near:    Bilateral Near:     Physical Exam GEN: well appearing female in no acute distress  CVS: well perfused  RESP: speaking in full sentences without pause  ABD: soft, non-tender, non-distended, no palpable masses ***  GU: deferred, patient performed self swab  ***Pelvic exam: normal external genitalia, vulva, VAGINA and CERVIX: {details:315904::normal appearing cervix without discharge or lesions}, ADNEXA: normal adnexa in size, nontender and no masses, WET MOUNT done - results: {:[id number]}, exam chaperoned by CMA.    UC Treatments / Results  Labs (all labs ordered are listed, but only abnormal results are displayed) Labs Reviewed  URINALYSIS, W/ REFLEX TO CULTURE (INFECTION SUSPECTED)    EKG   Radiology No results found.  Procedures Procedures (including critical care time)  Medications Ordered in UC Medications - No data to display  Initial Impression / Assessment and Plan / UC Course  I have reviewed the triage vital signs and the nursing notes.  Pertinent labs & imaging results that were available during my care of the patient were reviewed by me and considered in my medical decision making (see chart for details).     *** Patient is a 43 y.o.Paula Andrade female  who presents for **.  Overall patient is well-appearing and afebrile.  Vital signs stable.  UA ***consistent with acute cystitis.   Hematuria ***supported on microscopy.  Treat with ***Macrobid 2 times daily for 5 days. Urine culture obtained.***  Follow-up sensitivities and change antibiotics, if needed.  ***Vaginal swab for yeast vaginitis and bacterial vaginitis, trichomonas, gonorrhea and chlamydia obtained.   - Treatment:***Macrobid twice daily for 5 days.  Flagyl 500 BID x 7 days and advised patient to not drink alcohol while taking this medication.  - ***Diflucan for 2 doses for prevention of*** yeast infection  - ***Abstain from coitus during course of treatment.  - *** G/C Chlamydia and  call in Rx if positive.    Return precautions including abdominal pain, fever, chills, nausea, or vomiting given. Discussed MDM, treatment plan and plan for follow-up with patient ***who agrees with plan.    STI Screening *** GC and chlamydia DNA  probe sent to lab. HIV and RPR recommended and ***declined***collected. ***Negative Trichomoniasis on wet prep.  - Treatment: ***500 mg IM  CTX, po doxycycline 100 mg BID x7 days - Abstain from coitus during course of treatment - Advised patient to use barrier protection/condoms.  - F/U if symptoms not improving or getting worse.  - Safe sex handout given.     ***Acute cystitis:  Patient is a 43 y.o. female  who presents for ***days of ***dysuria and ***urinary urgency.  Overall patient is well-appearing and afebrile.  Vital signs stable.  UA consistent with ***acute cystitis.  Hematuria not ***supported on microscopy.  Patient has *** UTI in the past year. Treat with ***Macrobid 2 times daily for 5 days.  ***Urine culture obtained.  Follow-up sensitivities and change antibiotics, if needed.   - Return precautions including abdominal pain, fever, chills, nausea, or vomiting given. Follow-up,  if symptoms not improving or getting worse. Discussed MDM, treatment plan and plan for follow-up with patient***who agrees with plan.        Final Clinical Impressions(s) / UC Diagnoses   Final diagnoses:  None   Discharge Instructions   None    ED Prescriptions   None    PDMP not reviewed this encounter.

## 2024-01-10 NOTE — ED Triage Notes (Signed)
Pt presents with dysuria x 1 week.

## 2024-01-10 NOTE — Discharge Instructions (Signed)
 Stop by the pharmacy to pick up your prescriptions.  Follow up with your primary care provider or return to the urgent care, if not improving.

## 2024-04-14 ENCOUNTER — Ambulatory Visit (INDEPENDENT_AMBULATORY_CARE_PROVIDER_SITE_OTHER)

## 2024-04-14 ENCOUNTER — Ambulatory Visit
Admission: EM | Admit: 2024-04-14 | Discharge: 2024-04-14 | Disposition: A | Discharge status: Home / Self Care | Attending: Emergency Medicine | Admitting: Emergency Medicine

## 2024-04-14 DIAGNOSIS — R42 Dizziness and giddiness: Secondary | ICD-10-CM | POA: Insufficient documentation

## 2024-04-14 DIAGNOSIS — R079 Chest pain, unspecified: Secondary | ICD-10-CM | POA: Diagnosis present

## 2024-04-14 DIAGNOSIS — R0789 Other chest pain: Secondary | ICD-10-CM | POA: Insufficient documentation

## 2024-04-14 LAB — CBC WITH DIFFERENTIAL/PLATELET
Abs Immature Granulocytes: 0.01 K/uL (ref 0.00–0.07)
Basophils Absolute: 0 K/uL (ref 0.0–0.1)
Basophils Relative: 0 %
Eosinophils Absolute: 0.2 K/uL (ref 0.0–0.5)
Eosinophils Relative: 3 %
HCT: 35.8 % — ABNORMAL LOW (ref 36.0–46.0)
Hemoglobin: 12.4 g/dL (ref 12.0–15.0)
Immature Granulocytes: 0 %
Lymphocytes Relative: 33 %
Lymphs Abs: 2.1 K/uL (ref 0.7–4.0)
MCH: 31.9 pg (ref 26.0–34.0)
MCHC: 34.6 g/dL (ref 30.0–36.0)
MCV: 92 fL (ref 80.0–100.0)
Monocytes Absolute: 0.4 K/uL (ref 0.1–1.0)
Monocytes Relative: 6 %
Neutro Abs: 3.8 K/uL (ref 1.7–7.7)
Neutrophils Relative %: 58 %
Platelets: 229 K/uL (ref 150–400)
RBC: 3.89 MIL/uL (ref 3.87–5.11)
RDW: 12.4 % (ref 11.5–15.5)
WBC: 6.5 K/uL (ref 4.0–10.5)
nRBC: 0 % (ref 0.0–0.2)

## 2024-04-14 LAB — BASIC METABOLIC PANEL WITH GFR
Anion gap: 7 (ref 5–15)
BUN: 10 mg/dL (ref 6–20)
CO2: 23 mmol/L (ref 22–32)
Calcium: 8.5 mg/dL — ABNORMAL LOW (ref 8.9–10.3)
Chloride: 105 mmol/L (ref 98–111)
Creatinine, Ser: 0.77 mg/dL (ref 0.44–1.00)
GFR, Estimated: >60 mL/min (ref >60)
Glucose, Bld: 84 mg/dL (ref 70–99)
Potassium: 3.9 mmol/L (ref 3.5–5.1)
Sodium: 135 mmol/L (ref 135–145)

## 2024-04-14 LAB — PREGNANCY, URINE: Preg Test, Ur: NEGATIVE

## 2024-04-14 MED ORDER — NAPROXEN 500 MG PO TABS: 500.0000 mg | ORAL_TABLET | Freq: Two times a day (BID) | ORAL | 0 refills | Status: AC

## 2024-04-14 MED ORDER — PREDNISONE 20 MG PO TABS: 40.0000 mg | ORAL_TABLET | Freq: Every day | ORAL | 0 refills | Status: AC

## 2024-04-14 NOTE — ED Triage Notes (Signed)
 Pt c/o L sided chest pain x1 day. States pain worse when taking a deep breath. Denies any numbness,tingling or SOB. Hx of SVT. Was on metoprolol  PRN but has been out for awhile.

## 2024-04-14 NOTE — ED Provider Notes (Signed)
 HPI  SUBJECTIVE:  Paula Andrade is a 43 y.o. female who presents with left-sided chest pain described as throbbing, constant and tightness located next to her sternum for the past day.  States that it is getting better today.  No radiation of this pain up her neck, through to her back, or down her arm.  No change in physical activity, repetitive motion with her left arm, nausea, diaphoresis, rash, coughing, wheezing or shortness of breath, dyspnea exertion.  No calf pain, swelling, surgery in the past 4 weeks, recent immobilization, hemoptysis.  She is on OCPs.  No palpitations, fevers.  She tried ibuprofen 600 mg with improvement in her symptoms.  Last dose was within the past 6 hours.  Symptoms worse with deep inspiration, torso rotation and lying on her left side.  There is no exertional component to it. She has a past medical history of GERD, SVT and is followed by cardiology.  No history of cancer, PE/DVT, MI, coronary disease, CVA, diabetes, hypertension, BMI above 30, PAD/PVD.  Family history negative for early MI to her knowledge, but she is adopted.  LMP: 9/5.  Unsure if she could be pregnant.  PCP: In [facility].    She was seen in the ED in June 2024 for similar left-sided pain, troponin, EKG and chest x-ray were all reassuring.  She was thought to have chest wall pain and was sent home with NSAIDs.   Past Medical History:  Diagnosis Date   GERD (gastroesophageal reflux disease)    Tachycardia     Past Surgical History:  Procedure Laterality Date   CESAREAN SECTION      Family History  Adopted: Yes    Social History   Tobacco Use   Smoking status: Former    Types: Cigarettes   Smokeless tobacco: Never  Vaping Use   Vaping status: Never Used  Substance Use Topics   Alcohol use: Yes    Comment: occassional   Drug use: No    No current facility-administered medications for this encounter.  Current Outpatient Medications:    cetirizine (ZYRTEC) 10 MG chewable tablet,  Chew 10 mg by mouth as needed for allergies., Disp: , Rfl:    metoprolol  tartrate (LOPRESSOR ) 25 MG tablet, Take 1/2 tablet (12.5 mg) PO as needed every 4 hours if you develop palpitations/SVT (Patient not taking: Reported on 12/28/2016), Disp: 30 tablet, Rfl: 1   naproxen  (NAPROSYN ) 500 MG tablet, Take 1 tablet (500 mg total) by mouth 2 (two) times daily., Disp: 20 tablet, Rfl: 0   predniSONE  (DELTASONE ) 20 MG tablet, Take 2 tablets (40 mg total) by mouth daily with breakfast for 5 days., Disp: 10 tablet, Rfl: 0   TRINESSA LO 0.18/0.215/0.25 MG-25 MCG tab, Take 1 tablet by mouth daily., Disp: , Rfl:   No Known Allergies   ROS  As noted in HPI.   Physical Exam  BP 101/68 (BP Location: Left Arm)   Pulse 60   Temp 98.2 F (36.8 C) (Oral)   Resp 16   Ht 5' (1.524 m)   Wt 53.5 kg   LMP 03/28/2024 (Exact Date)   SpO2 100%   BMI 23.05 kg/m  Orthostatic VS for the past 24 hrs:  BP- Lying Pulse- Lying BP- Sitting Pulse- Sitting  04/14/24 1737 106/77 63 111/75 61     Constitutional: Well developed, well nourished, no acute distress Eyes:  EOMI, conjunctiva normal bilaterally HENT: Normocephalic, atraumatic,mucus membranes moist Respiratory: Normal inspiratory effort, lungs clear bilaterally.  Positive reproducible chest  wall tenderness along the left sternal/costochondral junctions.  No other chest wall tenderness. Cardiovascular: Normal rate, regular rhythm, no murmurs rubs or gallops GI: nondistended, soft, nontender.  No pulsatile palpable abdominal masses skin: No rash over the chest, skin intact Musculoskeletal: Calves symmetric, nontender, no edema Neurologic: Alert & oriented x 3, no focal neuro deficits Psychiatric: Speech and behavior appropriate   ED Course   Medications - No data to display  Orders Placed This Encounter  Procedures   DG Chest 2 View    Standing Status:   Standing    Number of Occurrences:   1    Reason for Exam (SYMPTOM  OR DIAGNOSIS REQUIRED):    Left sided reproducible chest pain along the sternum.  Rule out acute cardiopulmonary disease   CBC with Differential    Standing Status:   Standing    Number of Occurrences:   1   Basic metabolic panel    Standing Status:   Standing    Number of Occurrences:   1   Pregnancy, urine    Standing Status:   Standing    Number of Occurrences:   1   Orthostatic vital signs    Standing Status:   Standing    Number of Occurrences:   1   ED EKG    Standing Status:   Standing    Number of Occurrences:   1    Reason for Exam:   Chest Pain   EKG 12-Lead    Standing Status:   Standing    Number of Occurrences:   1    Results for orders placed or performed during the hospital encounter of 04/14/24 (from the past 24 hours)  CBC with Differential     Status: Abnormal   Collection Time: 04/14/24  5:16 PM  Result Value Ref Range   WBC 6.5 4.0 - 10.5 K/uL   RBC 3.89 3.87 - 5.11 MIL/uL   Hemoglobin 12.4 12.0 - 15.0 g/dL   HCT 64.1 (L) 63.9 - 53.9 %   MCV 92.0 80.0 - 100.0 fL   MCH 31.9 26.0 - 34.0 pg   MCHC 34.6 30.0 - 36.0 g/dL   RDW 87.5 88.4 - 84.4 %   Platelets 229 150 - 400 K/uL   nRBC 0.0 0.0 - 0.2 %   Neutrophils Relative % 58 %   Neutro Abs 3.8 1.7 - 7.7 K/uL   Lymphocytes Relative 33 %   Lymphs Abs 2.1 0.7 - 4.0 K/uL   Monocytes Relative 6 %   Monocytes Absolute 0.4 0.1 - 1.0 K/uL   Eosinophils Relative 3 %   Eosinophils Absolute 0.2 0.0 - 0.5 K/uL   Basophils Relative 0 %   Basophils Absolute 0.0 0.0 - 0.1 K/uL   Immature Granulocytes 0 %   Abs Immature Granulocytes 0.01 0.00 - 0.07 K/uL  Basic metabolic panel     Status: Abnormal   Collection Time: 04/14/24  5:16 PM  Result Value Ref Range   Sodium 135 135 - 145 mmol/L   Potassium 3.9 3.5 - 5.1 mmol/L   Chloride 105 98 - 111 mmol/L   CO2 23 22 - 32 mmol/L   Glucose, Bld 84 70 - 99 mg/dL   BUN 10 6 - 20 mg/dL   Creatinine, Ser 9.22 0.44 - 1.00 mg/dL   Calcium 8.5 (L) 8.9 - 10.3 mg/dL   GFR, Estimated >39 >39 mL/min    Anion gap 7 5 - 15  Pregnancy, urine  Status: None   Collection Time: 04/14/24  5:29 PM  Result Value Ref Range   Preg Test, Ur NEGATIVE NEGATIVE   DG Chest 2 View Result Date: 04/14/2024 EXAM: 2 VIEW(S) XRAY OF THE CHEST 04/14/2024 05:25:26 PM COMPARISON: 01/03/2023 CLINICAL HISTORY: Left sided reproducible chest pain along the sternum. Rule out acute cardiopulmonary disease. Pt c/o L sided chest pain x1 day. States pain worse when taking a deep breath. Denies any numbness, tingling or SOB. Hx of SVT. Was on metoprolol  PRN but has been out for awhile. FINDINGS: LUNGS AND PLEURA: No focal pulmonary opacity. No pulmonary edema. No pleural effusion. No pneumothorax. HEART AND MEDIASTINUM: No acute abnormality of the cardiac and mediastinal silhouettes. BONES AND SOFT TISSUES: No acute osseous abnormality. IMPRESSION: 1. No acute cardiopulmonary pathology. Electronically signed by: Selinda Blue MD 04/14/2024 05:50 PM EDT RP Workstation: [id number]    ED Clinical Impression  1. Chest wall pain   2. Chest pain, unspecified type   3. Positional lightheadedness      ED Assessment/Plan     Previous records, ER records reviewed.  As noted in HPI. 1.  Chest pain.  Suspect musculoskeletal chest pain as it is reproducible.  However, differential includes but is not limited to GERD, ACS, PE, pneumothorax, dissection, pneumonia, zoster.  EKG, CBC, BMP, chest x-ray.  EKG: Normal sinus rhythm, rate 63.  Normal axis, normal intervals.  No hypertrophy.  No ST-T wave changes.  No change compared to EKG from June 2024.  Patient symptomatic while EKG was obtained.  HEART score:   History: Slightly suspicious 0 EKG: Normal 0 Age: Below 45 0 Risk factors: No known risk factors 0 Troponin: Not available  Total score 0.  Patient is at low risk 0.9 to 1.7% risk for 30-day MACE.  Discussed this with patient  Unable to use PERC criteria due to birth control.  However Wells score is 0.  Low suspicion for  PE.   Reviewed imaging independently.  Normal chest x-ray.  See radiology report for full details.  Urine pregnancy negative.  CBC, BMP normal.  Suspect musculoskeletal chest pain.  Home with Naprosyn  500 mg p.o. twice daily, prednisone  40 mg for 5 days.  Push fluids.  ER return precautions given  2.  Lightheadedness/dizziness when going from bending forward to standing up rapidly.  She drinks 40 ounces of water a day.  Suspect mild hypovolemia.  Will check orthostatics, CBC, BMP.  Labs normal.  She is not orthostatic.  Will have her push fluids.  Discussed labs, imaging, MDM, treatment plan, and plan for follow-up with patient. Discussed sn/sx that should prompt return to the ED. patient agrees with plan.   Meds ordered this encounter  Medications   naproxen  (NAPROSYN ) 500 MG tablet    Sig: Take 1 tablet (500 mg total) by mouth 2 (two) times daily.    Dispense:  20 tablet    Refill:  0   predniSONE  (DELTASONE ) 20 MG tablet    Sig: Take 2 tablets (40 mg total) by mouth daily with breakfast for 5 days.    Dispense:  10 tablet    Refill:  0      *This clinic note was created using Scientist, clinical (histocompatibility and immunogenetics). Therefore, there may be occasional mistakes despite careful proofreading.  ?    Van Knee, MD 04/15/24 [phone]

## 2024-04-14 NOTE — Discharge Instructions (Signed)
 Your urine pregnancy was negative, and chest x-ray, CBC, basic metabolic panel were all normal.  I suspect inflammation of the cartilage between your sternum and ribs.  Take the Naprosyn  and prednisone  as written.  May take up as milligrams of Tylenol with the Naprosyn  twice a day.  Make sure you push electrolyte containing fluids such as liquid IV, Pedialyte, Gatorade.  Need to be drinking at least 64 ounces of water a day.  Go to the ER if your symptoms change, gets worse, or for any concerns.
